# Patient Record
Sex: Female | Born: 1990 | Race: White | Hispanic: No | Marital: Single | Smoking: Never smoker
Health system: Southern US, Community
[De-identification: ages and names within clinical notes are randomized; demographics above are authoritative.]

## PROBLEM LIST (undated history)

## (undated) ENCOUNTER — Inpatient Hospital Stay (HOSPITAL_COMMUNITY): Payer: Self-pay

## (undated) DIAGNOSIS — Q438 Other specified congenital malformations of intestine: Secondary | ICD-10-CM

## (undated) DIAGNOSIS — Z9889 Other specified postprocedural states: Secondary | ICD-10-CM

## (undated) DIAGNOSIS — L989 Disorder of the skin and subcutaneous tissue, unspecified: Secondary | ICD-10-CM

## (undated) DIAGNOSIS — C801 Malignant (primary) neoplasm, unspecified: Secondary | ICD-10-CM

## (undated) HISTORY — DX: Malignant (primary) neoplasm, unspecified: C80.1

## (undated) HISTORY — PX: BREAST SURGERY: SHX581

## (undated) HISTORY — PX: APPENDECTOMY: SHX54

## (undated) HISTORY — PX: OTHER SURGICAL HISTORY: SHX169

## (undated) HISTORY — PX: BREAST LUMPECTOMY: SHX2

---
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
## 2009-03-03 HISTORY — PX: BREAST SURGERY: SHX581

## 2009-04-23 ENCOUNTER — Encounter: Admission: RE | Admit: 2009-04-23 | Discharge: 2009-04-23 | Payer: Self-pay | Admitting: General Surgery

## 2010-03-24 ENCOUNTER — Encounter: Payer: Self-pay | Admitting: General Surgery

## 2011-03-04 NOTE — L&D Delivery Note (Signed)
Delivery Note At 6:00 PM a healthy female was delivered via Vaginal, Spontaneous Delivery (Presentation: LOA ).  APGAR: 9, 9; weight pending .   Placenta status: Intact, Spontaneous.  Cord: 3 vessels with the following complications: None.   Anesthesia: Epidural  Episiotomy: None Lacerations: 1st degree;Perineal and right labial Suture Repair: 2.0 vicryl rapide and 3-0 vicryl rapide Est. Blood Loss (mL): 350cc  Mom to postpartum.  Baby to stay with mom.Marland Kitchen  Oliver Pila 02/12/2012, 6:23 PM

## 2011-07-30 LAB — OB RESULTS CONSOLE GC/CHLAMYDIA: Gonorrhea: POSITIVE

## 2011-07-30 LAB — OB RESULTS CONSOLE HEPATITIS B SURFACE ANTIGEN: Hepatitis B Surface Ag: NEGATIVE

## 2011-07-30 LAB — OB RESULTS CONSOLE RUBELLA ANTIBODY, IGM: Rubella: UNDETERMINED

## 2011-07-30 LAB — OB RESULTS CONSOLE ABO/RH: RH Type: POSITIVE

## 2011-11-28 ENCOUNTER — Inpatient Hospital Stay (HOSPITAL_COMMUNITY)
Admission: AD | Admit: 2011-11-28 | Discharge: 2011-11-28 | Disposition: A | Discharge status: Home / Self Care | Payer: 59 | Source: Ambulatory Visit | Attending: Obstetrics and Gynecology | Admitting: Obstetrics and Gynecology

## 2011-11-28 ENCOUNTER — Encounter (HOSPITAL_COMMUNITY): Payer: Self-pay

## 2011-11-28 DIAGNOSIS — Q438 Other specified congenital malformations of intestine: Secondary | ICD-10-CM | POA: Insufficient documentation

## 2011-11-28 DIAGNOSIS — O26899 Other specified pregnancy related conditions, unspecified trimester: Secondary | ICD-10-CM

## 2011-11-28 DIAGNOSIS — R109 Unspecified abdominal pain: Secondary | ICD-10-CM | POA: Insufficient documentation

## 2011-11-28 DIAGNOSIS — O99891 Other specified diseases and conditions complicating pregnancy: Secondary | ICD-10-CM | POA: Insufficient documentation

## 2011-11-28 HISTORY — DX: Other specified congenital malformations of intestine: Q43.8

## 2011-11-28 LAB — URINALYSIS, ROUTINE W REFLEX MICROSCOPIC
Glucose, UA: NEGATIVE mg/dL
Hgb urine dipstick: NEGATIVE
Leukocytes, UA: NEGATIVE
Protein, ur: NEGATIVE mg/dL
pH: 8.5 — ABNORMAL HIGH (ref 5.0–8.0)

## 2011-11-28 MED ORDER — GI COCKTAIL ~~LOC~~
30.0000 mL | Freq: Once | ORAL | Status: AC
  Administered 2011-11-28: 30 mL via ORAL
  Filled 2011-11-28: qty 30

## 2011-11-28 NOTE — MAU Provider Note (Signed)
Chief Complaint:  Abdominal Pain   First Provider Initiated Contact with Patient 11/28/11 1936      HPI: Terri Wright is a 21 y.o. G1P0 at 86w0dwho presents to maternity admissions reporting constant upper abdominal pain since 1100 today. She describes it as sharp pinching pain throughout her upper abdomen that is constant but waxes and wanes. She has gotten partial relief from rest. She does not related to fetal movement or to her activity. No radiation or back pain. No similar previous episodes. Denies illness, N/V/D/C. No reflux symptoms.  Denies contractions, leakage of fluid or vaginal bleeding. Fetus is less active than usual but moves several times/hr.  Pregnancy Course: uncomplicated  Past Medical History: Past Medical History  Diagnosis Date  . Redundant colon     Past obstetric history: OB History    Grav Para Term Preterm Abortions TAB SAB Ect Mult Living   1              # Outc Date GA Lbr Len/2nd Wgt Sex Del Anes PTL Lv   1 CUR               Past Surgical History: Past Surgical History  Procedure Date  . Breast surgery     lumps removed  . Abdominal exploration surgery 2008    with diagnosis of redundant colon    Family History: Family History  Problem Relation Age of Onset  . Other Neg Hx     Social History: History  Substance Use Topics  . Smoking status: Never Smoker   . Smokeless tobacco: Not on file  . Alcohol Use: No    Allergies: No Known Allergies  Meds:  Prescriptions prior to admission  Medication Sig Dispense Refill  . Prenatal Vit-Fe Fumarate-FA (PRENATAL MULTIVITAMIN) TABS Take 1 tablet by mouth daily.        ROS: Pertinent findings in history of present illness.  Physical Exam  Blood pressure 119/75, pulse 98, temperature 99.2 F (37.3 C), temperature source Oral, resp. rate 16, height 5' 4.5" (1.638 m), weight 75.932 kg (167 lb 6.4 oz). GENERAL: Well-developed, well-nourished female in no acute distress, somewhat anxious    HEENT: normocephalic HEART: normal rate RESP: normal effort ABDOMEN: Soft, non-tender, gravid appropriate for gestational age EXTREMITIES: Nontender, no edema NEURO: alert and oriented CX: soft posterior, closed/ thick/-3 cephalic  FHT:  Baseline 130 , moderate variability, accelerations present, no decelerations Contractions: none   Labs: Results for orders placed during the hospital encounter of 11/28/11 (from the past 24 hour(s))  URINALYSIS, ROUTINE W REFLEX MICROSCOPIC     Status: Abnormal   Collection Time   11/28/11  7:00 PM      Component Value Range   Color, Urine YELLOW  YELLOW   APPearance CLOUDY (*) CLEAR   Specific Gravity, Urine 1.015  1.005 - 1.030   pH 8.5 (*) 5.0 - 8.0   Glucose, UA NEGATIVE  NEGATIVE mg/dL   Hgb urine dipstick NEGATIVE  NEGATIVE   Bilirubin Urine NEGATIVE  NEGATIVE   Ketones, ur NEGATIVE  NEGATIVE mg/dL   Protein, ur NEGATIVE  NEGATIVE mg/dL   Urobilinogen, UA 0.2  0.0 - 1.0 mg/dL   Nitrite NEGATIVE  NEGATIVE   Leukocytes, UA NEGATIVE  NEGATIVE     ED Course GI Cocktail given without relief of sx. Still lokks comfortable  Assessment: 1. Abdominal pain in pregnancy   2. Redundant colon   Discomfort possibly due to colon anomaly or late gestational discomforts Gravida1 at  28 wks Cat 1 FHR  Plan: C/W Dr. Ellyn Hack -> try GI cocktail       Medication List     As of 11/28/2011  7:37 PM    ASK your doctor about these medications         prenatal multivitamin Tabs   Take 1 tablet by mouth daily.      F/U 12/08/11 in office. Call sooner if worse.  Danae Orleans, CNM 11/28/2011 7:37 PM

## 2011-11-28 NOTE — MAU Note (Signed)
Constant mid to upper abdominal pain since 11am this morning. Can't tell if pain is contractions or if abdomen tightens with pain. Denies nausea, vomiting, diarrhea or constipation. Denies vaginal bleeding. Positive fetal movement.

## 2012-02-12 ENCOUNTER — Inpatient Hospital Stay (HOSPITAL_COMMUNITY)
Admission: AD | Admit: 2012-02-12 | Discharge: 2012-02-14 | DRG: 775 | Disposition: A | Discharge status: Home / Self Care | Payer: 59 | Source: Ambulatory Visit | Attending: Obstetrics and Gynecology | Admitting: Obstetrics and Gynecology

## 2012-02-12 ENCOUNTER — Encounter (HOSPITAL_COMMUNITY): Payer: Self-pay | Admitting: *Deleted

## 2012-02-12 ENCOUNTER — Encounter (HOSPITAL_COMMUNITY): Payer: Self-pay | Admitting: Anesthesiology

## 2012-02-12 ENCOUNTER — Inpatient Hospital Stay (HOSPITAL_COMMUNITY): Payer: 59 | Admitting: Anesthesiology

## 2012-02-12 DIAGNOSIS — O4290 Premature rupture of membranes, unspecified as to length of time between rupture and onset of labor, unspecified weeks of gestation: Secondary | ICD-10-CM | POA: Diagnosis present

## 2012-02-12 LAB — CBC
HCT: 29.3 % — ABNORMAL LOW (ref 36.0–46.0)
MCH: 28 pg (ref 26.0–34.0)
MCV: 84.7 fL (ref 78.0–100.0)
RDW: 12.9 % (ref 11.5–15.5)
WBC: 10.2 10*3/uL (ref 4.0–10.5)

## 2012-02-12 MED ORDER — DIPHENHYDRAMINE HCL 50 MG/ML IJ SOLN: 12.5000 mg | INTRAMUSCULAR | Status: DC | PRN

## 2012-02-12 MED ORDER — SENNOSIDES-DOCUSATE SODIUM 8.6-50 MG PO TABS
2.0000 | ORAL_TABLET | Freq: Every day | ORAL | Status: DC
  Administered 2012-02-12 – 2012-02-13 (×2): 2 via ORAL

## 2012-02-12 MED ORDER — BUTORPHANOL TARTRATE 1 MG/ML IJ SOLN
1.0000 mg | INTRAMUSCULAR | Status: DC | PRN
  Administered 2012-02-12: 1 mg via INTRAVENOUS
  Filled 2012-02-12: qty 1

## 2012-02-12 MED ORDER — PHENYLEPHRINE 40 MCG/ML (10ML) SYRINGE FOR IV PUSH (FOR BLOOD PRESSURE SUPPORT): 80.0000 ug | PREFILLED_SYRINGE | INTRAVENOUS | Status: DC | PRN

## 2012-02-12 MED ORDER — EPHEDRINE 5 MG/ML INJ: 10.0000 mg | INTRAVENOUS | Status: DC | PRN

## 2012-02-12 MED ORDER — EPHEDRINE 5 MG/ML INJ
10.0000 mg | INTRAVENOUS | Status: DC | PRN
  Filled 2012-02-12: qty 4

## 2012-02-12 MED ORDER — OXYTOCIN 40 UNITS IN LACTATED RINGERS INFUSION - SIMPLE MED
1.0000 m[IU]/min | INTRAVENOUS | Status: DC
  Administered 2012-02-12: 1 m[IU]/min via INTRAVENOUS
  Filled 2012-02-12: qty 1000

## 2012-02-12 MED ORDER — SIMETHICONE 80 MG PO CHEW: 80.0000 mg | CHEWABLE_TABLET | ORAL | Status: DC | PRN

## 2012-02-12 MED ORDER — OXYCODONE-ACETAMINOPHEN 5-325 MG PO TABS: 1.0000 | ORAL_TABLET | ORAL | Status: DC | PRN

## 2012-02-12 MED ORDER — TERBUTALINE SULFATE 1 MG/ML IJ SOLN: 0.2500 mg | Freq: Once | INTRAMUSCULAR | Status: DC | PRN

## 2012-02-12 MED ORDER — LACTATED RINGERS IV SOLN: 500.0000 mL | Freq: Once | INTRAVENOUS | Status: DC

## 2012-02-12 MED ORDER — LACTATED RINGERS IV SOLN: 500.0000 mL | INTRAVENOUS | Status: DC | PRN

## 2012-02-12 MED ORDER — OXYTOCIN 40 UNITS IN LACTATED RINGERS INFUSION - SIMPLE MED
62.5000 mL/h | INTRAVENOUS | Status: DC
  Administered 2012-02-12: 62.5 mL/h via INTRAVENOUS

## 2012-02-12 MED ORDER — DIBUCAINE 1 % RE OINT: 1.0000 "application " | TOPICAL_OINTMENT | RECTAL | Status: DC | PRN

## 2012-02-12 MED ORDER — PRENATAL MULTIVITAMIN CH
1.0000 | ORAL_TABLET | Freq: Every day | ORAL | Status: DC
  Filled 2012-02-12 (×2): qty 1

## 2012-02-12 MED ORDER — ZOLPIDEM TARTRATE 5 MG PO TABS: 5.0000 mg | ORAL_TABLET | Freq: Every evening | ORAL | Status: DC | PRN

## 2012-02-12 MED ORDER — ONDANSETRON HCL 4 MG/2ML IJ SOLN: 4.0000 mg | Freq: Four times a day (QID) | INTRAMUSCULAR | Status: DC | PRN

## 2012-02-12 MED ORDER — BENZOCAINE-MENTHOL 20-0.5 % EX AERO: 1.0000 "application " | INHALATION_SPRAY | CUTANEOUS | Status: DC | PRN

## 2012-02-12 MED ORDER — LANOLIN HYDROUS EX OINT: TOPICAL_OINTMENT | CUTANEOUS | Status: DC | PRN

## 2012-02-12 MED ORDER — FLEET ENEMA 7-19 GM/118ML RE ENEM: 1.0000 | ENEMA | RECTAL | Status: DC | PRN

## 2012-02-12 MED ORDER — CITRIC ACID-SODIUM CITRATE 334-500 MG/5ML PO SOLN: 30.0000 mL | ORAL | Status: DC | PRN

## 2012-02-12 MED ORDER — ACETAMINOPHEN 325 MG PO TABS: 650.0000 mg | ORAL_TABLET | ORAL | Status: DC | PRN

## 2012-02-12 MED ORDER — TETANUS-DIPHTH-ACELL PERTUSSIS 5-2.5-18.5 LF-MCG/0.5 IM SUSP: 0.5000 mL | Freq: Once | INTRAMUSCULAR | Status: DC

## 2012-02-12 MED ORDER — LACTATED RINGERS IV SOLN
INTRAVENOUS | Status: DC
  Administered 2012-02-12 (×4): via INTRAVENOUS

## 2012-02-12 MED ORDER — LIDOCAINE HCL (PF) 1 % IJ SOLN
30.0000 mL | INTRAMUSCULAR | Status: DC | PRN
  Filled 2012-02-12: qty 30

## 2012-02-12 MED ORDER — ONDANSETRON HCL 4 MG PO TABS: 4.0000 mg | ORAL_TABLET | ORAL | Status: DC | PRN

## 2012-02-12 MED ORDER — DIPHENHYDRAMINE HCL 25 MG PO CAPS: 25.0000 mg | ORAL_CAPSULE | Freq: Four times a day (QID) | ORAL | Status: DC | PRN

## 2012-02-12 MED ORDER — FENTANYL 2.5 MCG/ML BUPIVACAINE 1/10 % EPIDURAL INFUSION (WH - ANES)
14.0000 mL/h | INTRAMUSCULAR | Status: DC
  Administered 2012-02-12: 14 mL/h via EPIDURAL
  Filled 2012-02-12: qty 125

## 2012-02-12 MED ORDER — ONDANSETRON HCL 4 MG/2ML IJ SOLN: 4.0000 mg | INTRAMUSCULAR | Status: DC | PRN

## 2012-02-12 MED ORDER — PHENYLEPHRINE 40 MCG/ML (10ML) SYRINGE FOR IV PUSH (FOR BLOOD PRESSURE SUPPORT)
80.0000 ug | PREFILLED_SYRINGE | INTRAVENOUS | Status: DC | PRN
  Filled 2012-02-12: qty 5

## 2012-02-12 MED ORDER — BUTORPHANOL TARTRATE 1 MG/ML IJ SOLN
1.0000 mg | Freq: Once | INTRAMUSCULAR | Status: AC
  Administered 2012-02-12: 1 mg via INTRAVENOUS
  Filled 2012-02-12: qty 1

## 2012-02-12 MED ORDER — IBUPROFEN 600 MG PO TABS: 600.0000 mg | ORAL_TABLET | Freq: Four times a day (QID) | ORAL | Status: DC | PRN

## 2012-02-12 MED ORDER — LIDOCAINE HCL (PF) 1 % IJ SOLN
INTRAMUSCULAR | Status: DC | PRN
  Administered 2012-02-12 (×2): 5 mL
  Administered 2012-02-12: 30 mL via INTRAMUSCULAR

## 2012-02-12 MED ORDER — IBUPROFEN 600 MG PO TABS
600.0000 mg | ORAL_TABLET | Freq: Four times a day (QID) | ORAL | Status: DC
  Administered 2012-02-13 – 2012-02-14 (×6): 600 mg via ORAL
  Filled 2012-02-12 (×6): qty 1

## 2012-02-12 MED ORDER — OXYTOCIN BOLUS FROM INFUSION
500.0000 mL | INTRAVENOUS | Status: DC
  Administered 2012-02-12: 500 mL via INTRAVENOUS

## 2012-02-12 MED ORDER — WITCH HAZEL-GLYCERIN EX PADS: 1.0000 "application " | MEDICATED_PAD | CUTANEOUS | Status: DC | PRN

## 2012-02-12 NOTE — MAU Provider Note (Signed)
S: 21 y.o. G1P0 @ [redacted]w[redacted]d presents to MAU with leakage of clear fluid x2-3 episodes.  Pt is wearing a pad for this leakage upon arrival.  She reports good fetal movement, denies vaginal bleeding, vaginal itching/burning, urinary symptoms, h/a, dizziness, n/v, or fever/chills.    O: BP 130/86  Pulse 105  Temp 97.9 F (36.6 C) (Oral)  Resp 18  Speculum exam:  Pt grossly ruptured with positive pooling and large leakage of fluid onto bed with placement of speculum    A: SROM @[redacted]w[redacted]d   P: RN to call MD regarding results  Sharen Counter Certified Nurse-Midwife

## 2012-02-12 NOTE — Progress Notes (Signed)
Patient ID: Terri Wright, female   DOB: Nov 14, 1990, 21 y.o.   MRN: [id number] Pt is not feeling many contractions yet, but is started on pitocin FHR reactive Cervix at 0745 am 50/3-4/-1 per RN exam Will follow progress.

## 2012-02-12 NOTE — H&P (Signed)
NAMEDAYNE, DEKAY NO.:  [id number]  MEDICAL RECORD NO.:  [id number]  LOCATION:  9175                          FACILITY:  [facility]  PHYSICIAN:  Malachi Pro. Ambrose Mantle, M.D. DATE OF BIRTH:  [birth year]  DATE OF ADMISSION:  02/12/2012 DATE OF DISCHARGE:                             HISTORY & PHYSICAL   PRESENT ILLNESS:  This is a 21 year old white female, para 0, gravida 1, with EDC February 20, 2012, admitted with premature rupture of the membranes.  The patient states that her membranes began leaking at 1:45 a.m.  She came to the hospital at 3:17 a.m.  Rupture of membranes was confirmed and she was kept in MAU for 4 hours waiting for a bed.  She has now been admitted.  Blood group and type A positive with a negative antibody.  Pap smear negative, rubella equivocal, RPR nonreactive. Urine culture negative.  Hepatitis B surface antigen negative, HIV negative.  According to the lab slip on Jul 30, 2011, GC was positive, Chlamydia was negative, however, the patient was not treated and it was entered into her progress notes that all labs were negative, so I think it is a clerical error.  The patient was never informed that she had a positive test.  Cystic fibrosis screening and first trimester screening as well as AFP were all declined.  One hour Glucola was 107, group B strep was negative, and repeat gonorrhea and chlamydia at 36 weeks were negative.  The patient began her prenatal course in our office at 10 weeks and 5 days.  She declined all genetic screenings.  Her prenatal course was essentially uncomplicated.  At her last exam in the office on February 10, 2012, the cervix was 3 cm, 80% effaced, vertex was thought to be at a 0 station.  She is admitted now for induction of labor if spontaneous labor does not ensue.  PAST MEDICAL HISTORY:  She states that she has infrequent bowel movements secondary to an excessively long colon.  PAST SURGICAL HISTORY:  Surgeries  include breast lumpectomies x3, some type of colon surgery to be sure how much colon there was.  ALLERGIES:  She has no known allergies.  FAMILY MEDICAL HISTORY:  Father and mother have had depression.  Father has high blood pressure as well as her mother.  Maternal grandmother has arthritis and COPD as well as hypertension.  Maternal grandfather with epilepsy and paternal grandmother with breast cancer.  The patient has never been pregnant.  She is active but it does not have a formal exercise program.  She denies alcohol, tobacco, and illicit substance abuse.  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.  PHYSICAL EXAMINATION:  VITAL SIGNS:  Temperature 97.9, pulse 88, respirations 18, blood pressure 116/86. HEART:  Normal size and sounds.  No murmurs. LUNGS:  Clear to auscultation. PELVIC:  Fundal height at her last prenatal visit was 37 cm per the RN exam in labor and delivery.  The cervix is 3-4 cm, 50%, posterior and the vertex is high.  ADMITTING IMPRESSION:  Intrauterine pregnancy at 38 weeks and 6 days, premature rupture of the membranes.  The patient is admitted  for observation and if no labor, begin Pitocin.     Malachi Pro. Ambrose Mantle, M.D.     TFH/MEDQ  D:  02/12/2012  T:  02/12/2012  Job:  [id number]

## 2012-02-12 NOTE — MAU Note (Signed)
Had a gush of fluid around 0145 this morning. Has been trickling every since

## 2012-02-12 NOTE — Anesthesia Preprocedure Evaluation (Signed)

## 2012-02-12 NOTE — MAU Note (Signed)
Speculum exam performed by Vivien Presto CNM . Positive for pooling

## 2012-02-12 NOTE — Anesthesia Procedure Notes (Signed)
Epidural Patient location during procedure: OB Start time: 02/12/2012 4:26 PM  Staffing Anesthesiologist: Brayton Caves R Performed by: anesthesiologist   Preanesthetic Checklist Completed: patient identified, site marked, surgical consent, pre-op evaluation, timeout performed, IV checked, risks and benefits discussed and monitors and equipment checked  Epidural Patient position: sitting Prep: site prepped and draped and DuraPrep Patient monitoring: continuous pulse ox and blood pressure Approach: midline Injection technique: LOR air and LOR saline  Needle:  Needle type: Tuohy  Needle gauge: 17 G Needle length: 9 cm and 9 Needle insertion depth: 5 cm cm Catheter type: closed end flexible Catheter size: 19 Gauge Catheter at skin depth: 10 cm Test dose: negative  Assessment Events: blood not aspirated, injection not painful, no injection resistance, negative IV test and no paresthesia  Additional Notes Patient identified.  Risk benefits discussed including failed block, incomplete pain control, headache, nerve damage, paralysis, blood pressure changes, nausea, vomiting, reactions to medication both toxic or allergic, and postpartum back pain.  Patient expressed understanding and wished to proceed.  All questions were answered.  Sterile technique used throughout procedure and epidural site dressed with sterile barrier dressing. No paresthesia or other complications noted.The patient did not experience any signs of intravascular injection such as tinnitus or metallic taste in mouth nor signs of intrathecal spread such as rapid motor block. Please see nursing notes for vital signs.

## 2012-02-13 LAB — CBC
HCT: 25.2 % — ABNORMAL LOW (ref 36.0–46.0)
Hemoglobin: 8.2 g/dL — ABNORMAL LOW (ref 12.0–15.0)
MCH: 27.7 pg (ref 26.0–34.0)
MCHC: 32.5 g/dL (ref 30.0–36.0)
RDW: 12.8 % (ref 11.5–15.5)

## 2012-02-13 NOTE — Anesthesia Postprocedure Evaluation (Signed)
Anesthesia Post Note  Patient: Terri Wright  Procedure(s) Performed: * No procedures listed *  Anesthesia type: Epidural  Patient location: Mother/Baby  Post pain: Pain level controlled  Post assessment: Post-op Vital signs reviewed  Last Vitals:  Filed Vitals:   02/13/12 0547  BP: 99/70  Pulse: 91  Temp: 37.1 C  Resp: 18    Post vital signs: Reviewed  Level of consciousness: awake  Complications: No apparent anesthesia complications

## 2012-02-13 NOTE — Progress Notes (Signed)
Post Partum Day 1 Subjective: no complaints, up ad lib and tolerating PO  Objective: Blood pressure 99/70, pulse 91, temperature 98.8 F (37.1 C), temperature source Oral, resp. rate 18, height 5\' 5"  (1.651 m), weight 85.276 kg (188 lb), SpO2 97.00%, unknown if currently breastfeeding.  Physical Exam:  General: alert and cooperative Lochia: appropriate Uterine Fundus: firm    Basename 02/12/12 0430  HGB 9.7*  HCT 29.3*    Assessment/Plan: Plan for discharge tomorrow   LOS: 1 day   Terri Wright W 02/13/2012, 6:28 AM

## 2012-02-14 MED ORDER — MEASLES, MUMPS & RUBELLA VAC ~~LOC~~ INJ
0.5000 mL | INJECTION | Freq: Once | SUBCUTANEOUS | Status: AC
  Administered 2012-02-14: 0.5 mL via SUBCUTANEOUS
  Filled 2012-02-14: qty 0.5

## 2012-02-14 NOTE — Discharge Summary (Signed)
Obstetric Discharge Summary Reason for Admission: rupture of membranes Prenatal Procedures: none Intrapartum Procedures: spontaneous vaginal delivery Postpartum Procedures: none Complications-Operative and Postpartum: 1st degree perineal laceration and labial laceration Hemoglobin  Date Value Range Status  02/13/2012 8.2* 12.0 - 15.0 g/dL Final     HCT  Date Value Range Status  02/13/2012 25.2* 36.0 - 46.0 % Final    Physical Exam:  General: alert Lochia: appropriate Uterine Fundus: firm  Discharge Diagnoses: Term Pregnancy-delivered  Discharge Information: Date: 02/14/2012 Activity: pelvic rest Diet: routine Medications: Ibuprofen Condition: stable Instructions: refer to practice specific booklet Discharge to: home Follow-up Information    Follow up with Oliver Pila, MD. Schedule an appointment as soon as possible for a visit in 6 weeks.   Contact information:   [address], [address] [place] [address] [phone]          Newborn Data: Live born female  Birth Weight: 6 lb 14.4 oz (3130 g) APGAR: 9, 9  Home with mother.  Terri Wright 02/14/2012, 9:56 AM

## 2012-02-14 NOTE — Progress Notes (Signed)
PPD #2 Doing well Afeb, VSS Fundus firm D/c home 

## 2012-10-27 ENCOUNTER — Ambulatory Visit (INDEPENDENT_AMBULATORY_CARE_PROVIDER_SITE_OTHER): Payer: 59 | Admitting: Physician Assistant

## 2012-10-27 VITALS — BP 110/70 | HR 90 | Temp 98.4°F | Resp 16 | Ht 65.0 in | Wt 154.2 lb

## 2012-10-27 DIAGNOSIS — H6690 Otitis media, unspecified, unspecified ear: Secondary | ICD-10-CM

## 2012-10-27 DIAGNOSIS — H6691 Otitis media, unspecified, right ear: Secondary | ICD-10-CM

## 2012-10-27 MED ORDER — AMOXICILLIN 500 MG PO TABS: 1000.0000 mg | ORAL_TABLET | Freq: Three times a day (TID) | ORAL | Status: DC

## 2012-10-27 NOTE — Patient Instructions (Addendum)
Begin taking the antibiotics today - take as directed and be sure to finish the full course, even if you start feeling better.  Tylenol or Advil for pain relief.  Please let us know if any symptoms are worsening or not improving   Otitis Media, Adult A middle ear infection is an infection in the space behind the eardrum. The medical name for this is "otitis media." It may happen after a common cold. It is caused by a germ that starts growing in that space. You may feel swollen glands in your neck on the side of the ear infection. HOME CARE INSTRUCTIONS   Take your medicine as directed until it is gone, even if you feel better after the first few days.  Only take over-the-counter or prescription medicines for pain, discomfort, or fever as directed by your caregiver.  Occasional use of a nasal decongestant a couple times per day may help with discomfort and help the eustachian tube to drain better. Follow up with your caregiver in 10 to 14 days or as directed, to be certain that the infection has cleared. Not keeping the appointment could result in a chronic or permanent injury, pain, hearing loss and disability. If there is any problem keeping the appointment, you must call back to this facility for assistance. SEEK IMMEDIATE MEDICAL CARE IF:   You are not getting better in 2 to 3 days.  You have pain that is not controlled with medication.  You feel worse instead of better.  You cannot use the medication as directed.  You develop swelling, redness or pain around the ear or stiffness in your neck. MAKE SURE YOU:   Understand these instructions.  Will watch your condition.  Will get help right away if you are not doing well or get worse. Document Released: 11/23/2003 Document Revised: 05/12/2011 Document Reviewed: 09/24/2007 [facility] Patient Information 2014 [place], [place].

## 2012-10-27 NOTE — Progress Notes (Signed)
  Subjective:    Patient ID: Terri Wright, female    DOB: [birth year], 22 y.o.   MRN: [id number]  HPI   Ms. Zakarian is a pleasant 22 yr old female here with right ear pain that began last night.  Started about 7:30pm.  At first felt pressure like she was driving through the mountains.  Pain continued to get more intense.  Hardly got any sleep last night due to pain.  Did take some Tylenol to help her get some sleep.  Hearing muffled on the right.  Left ear feels totally fine.  No URI symptoms, but did have a little ST earlier in the week.  No allergy problems.  Has tried several home remedies incl ear drops, peroxide, warm water, etc - all with no relief.  Does not some drainage from the ear, but thinks this may be from the drops she put in last night.  Denies FB.    Review of Systems  Constitutional: Negative for fever and chills.  HENT: Positive for hearing loss, ear pain and ear discharge. Negative for congestion and rhinorrhea.   Respiratory: Negative.   Cardiovascular: Negative.   Gastrointestinal: Negative.   Musculoskeletal: Negative.   Skin: Negative.   Neurological: Negative.        Objective:   Physical Exam  Vitals reviewed. Constitutional: She is oriented to person, place, and time. She appears well-developed and well-nourished. No distress.  HENT:  Head: Normocephalic and atraumatic.  Right Ear: External ear and ear canal normal. Tympanic membrane is erythematous and bulging.  Left Ear: Tympanic membrane, external ear and ear canal normal.  Eyes: Conjunctivae are normal. No scleral icterus.  Neck: Neck supple.  Cardiovascular: Normal rate, regular rhythm and normal heart sounds.   Pulmonary/Chest: Effort normal and breath sounds normal.  Lymphadenopathy:    She has no cervical adenopathy.  Neurological: She is alert and oriented to person, place, and time.  Skin: Skin is warm and dry.  Psychiatric: She has a normal mood and affect. Her behavior is normal.         Assessment & Plan:  Otitis media, right - Plan: amoxicillin (AMOXIL) 500 MG tablet   Ms. Myron is a 22 yr old female with RIGHT otitis media.  Will treat with amox 1g TID x 10 days.  Tylenol and/or Advil for pain relief.  May try otc decongestant to help relieve pressure.  If any symptoms worsening or not improving pt to RTC.

## 2013-06-02 ENCOUNTER — Ambulatory Visit (INDEPENDENT_AMBULATORY_CARE_PROVIDER_SITE_OTHER): Payer: 59

## 2013-06-02 ENCOUNTER — Encounter: Payer: Self-pay | Admitting: Orthopedic Surgery

## 2013-06-02 ENCOUNTER — Ambulatory Visit (INDEPENDENT_AMBULATORY_CARE_PROVIDER_SITE_OTHER): Payer: 59 | Admitting: Orthopedic Surgery

## 2013-06-02 VITALS — BP 125/82 | Ht 65.0 in | Wt 164.0 lb

## 2013-06-02 DIAGNOSIS — M2240 Chondromalacia patellae, unspecified knee: Secondary | ICD-10-CM

## 2013-06-02 DIAGNOSIS — M25569 Pain in unspecified knee: Secondary | ICD-10-CM

## 2013-06-02 DIAGNOSIS — M2241 Chondromalacia patellae, right knee: Secondary | ICD-10-CM

## 2013-06-02 DIAGNOSIS — M25561 Pain in right knee: Secondary | ICD-10-CM

## 2013-06-02 MED ORDER — NABUMETONE 500 MG PO TABS: 500.0000 mg | ORAL_TABLET | Freq: Two times a day (BID) | ORAL | Status: DC

## 2013-06-02 MED ORDER — TRAMADOL-ACETAMINOPHEN 37.5-325 MG PO TABS: 1.0000 | ORAL_TABLET | ORAL | Status: DC | PRN

## 2013-06-02 NOTE — Patient Instructions (Addendum)
Call to arrange therapy at [facility] exam shows your knee pain is likely due to a cartilage swelling and irritation under the knee cap called chondromalacia. The knee cap moves up and down in its groove when you walk, run, or squat. It can become irritated from sports or work activities if the knee cap is not lined up perfectly or your quadriceps muscle is relatively weak. This can cause pain, usually around the knee cap but sometimes the back of the knee. It is most common in young and active people. Climbing stairs, prolonged sitting and rising from a chair will often make the pain worse. Treatment includes rest from activities which make it worse. The pain can be reduced with ice packs and anti-inflammatory pain medicine. Exercises to strengthen the thigh (quadriceps) muscle may help prevent further episodes of this condition. Shoe inserts to correct imbalances in the legs or feet may be prescribed by your doctor or a specialist. Support for the knee cap with a light brace may also be helpful. Call your caregiver if you are not improving after 2 - 3 weeks of treatment.  SEEK MEDICAL CARE IF:  You have increasing pain or your knee becomes hot, swollen, red, or begins to give out or lock up on you. Document Released: 03/27/2004 Document Revised: 05/12/2011 Document Reviewed: 08/15/2008 [facility] Patient Information 2014 [place].

## 2013-06-02 NOTE — Progress Notes (Signed)
Patient ID: Terri Wright, female   DOB: [birth year], 23 y.o.   MRN: [id number]  Chief Complaint  Patient presents with  . Knee Pain    Right knee pain injury 04/16/13    History  Year-old female history of pain in her right knee on and off for years but became worse after she was injured in February during a ski trip. At that time she injured the knee it became bruised and swollen there was questionable also going to the ER she declined but she wants her knee checked out because of the continued pain. The pain comes and is associated with stairs sitting for long periods of time. The pain is moderately severe as 7/10 described as sharp stabbing feels a catching sensation under the knee cap there is some swelling noted  Review of systems negative except for musculoskeletal  No allergies  History of redundant colon  History of exploratory laparotomy and lumpectomies/cystectomies from the breast unclear on her history there.  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  Cardiovascular exam is normal Sensory exam normal  Encounter Diagnoses  Name Primary?  . Right knee pain   . Chondromalacia of patella, right Yes    The x-rays were normal  Followup after therapy  Meds ordered this encounter  Medications  . nabumetone (RELAFEN) 500 MG tablet    Sig: Take 1 tablet (500 mg total) by mouth 2 (two) times daily.    Dispense:   60 tablet    Refill:  5  . traMADol-acetaminophen (ULTRACET) 37.5-325 MG per tablet    Sig: Take 1 tablet by mouth every 4 (four) hours as needed.    Dispense:  90 tablet    Refill:  5

## 2013-06-15 ENCOUNTER — Ambulatory Visit (HOSPITAL_COMMUNITY)
Admission: RE | Admit: 2013-06-15 | Discharge: 2013-06-15 | Disposition: A | Discharge status: Home / Self Care | Payer: 59 | Source: Ambulatory Visit | Attending: Orthopedic Surgery | Admitting: Orthopedic Surgery

## 2013-06-15 DIAGNOSIS — IMO0001 Reserved for inherently not codable concepts without codable children: Secondary | ICD-10-CM | POA: Insufficient documentation

## 2013-06-15 DIAGNOSIS — M25569 Pain in unspecified knee: Secondary | ICD-10-CM | POA: Insufficient documentation

## 2013-06-15 DIAGNOSIS — R269 Unspecified abnormalities of gait and mobility: Secondary | ICD-10-CM | POA: Insufficient documentation

## 2013-06-15 NOTE — Evaluation (Signed)
Physical Therapy Evaluation  Patient Details  Name: Terri Wright MRN: [id number] Date of Birth: 12-Dec-1990  Today's Date: 06/15/2013 Time: 0530-0620 PT Time Calculation (min): 50 min  Charges:1 evaluation, 6:05-6:20 Therapeutic Exercise            Visit#: 1 of 16  Re-eval: 07/15/13 Assessment Diagnosis: Knee pain Next MD Visit: week of the 25th of may Prior Therapy: never  Authorization: [facility]     Past Medical History:  Past Medical History  Diagnosis Date  . Redundant colon    Past Surgical History:  Past Surgical History  Procedure Laterality Date  . Breast surgery      lumps removed  . Abdominal exploration surgery  2008    with diagnosis of redundant colon  . Breast lumpectomy    . Appendectomy      Subjective Symptoms/Limitations Symptoms: sharp pain, dull ache that slowly eases.  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 histry of knee pain for 7 years with increased pain since February during which she fell and had major pain. no noted pop or pull but she fell tknee twist. Knee was swollen and bruised tho none now. Patient had her first child 24 years ago.  Limitations: Sitting How long can you sit comfortably?: pain following prolonged sitting.  Pain Assessment Currently in Pain?: No/denies Pain Score: 6  (when going up stairs. ) Pain Location: Knee Pain Orientation: Right Pain Radiating Towards: up from knee allong lateral thigh Pain Onset: More than a month ago Pain Frequency: Intermittent Pain Relieving Factors: massage.  Effect of Pain on Daily Activities: running, hiking, going up stairs. playing with daughter,   Precautions/Restrictions  Precautions Precautions: None  Prior Functioning  Prior Function Level of Independence: Independent with basic ADLs  Cognition/Observation Cognition Overall Cognitive Status: Within Functional Limits for tasks assessed Arousal/Alertness: Awake/alert Observation/Other Assessments Observations: Gait:  Increased midfoot pronation through out gait, limited tibial internal rotation in transition zone 1, excessive toe out, and increased knee valgus moment in stance. Other Assessments: 3D hip excursion: limited hip extension, limited hip flexion, limited hip adduction, limited hip  internal rotation.   Sensation/Coordination/Flexibility/Functional Tests Sensation Light Touch: Appears Intact Coordination Gross Motor Movements are Fluid and Coordinated: Yes Flexibility Thomas: Positive Obers: Positive 90/90: Positive Functional Tests Functional Tests: Piriformis test: positive for limited mobility,  Functional Tests: Squatting: limited depth due to instability and limited glut/calf flexibility Functional Tests: single leg squat: depth <45degrees  Assessment RLE AROM (degrees) RLE Overall AROM Comments: patient R foot displays decreased midfoot locking mechanism with calcaneal inversion compared to Lt LE resulting in increased midfoot pronation prior to toe off.  Right Hip Extension: 10 Right Hip Flexion: 115 Right Hip ABduction: 30 Right Hip ADduction: 15 Right Knee Extension: 0 Right Knee Flexion: 125 Right Ankle Dorsiflexion: 8 RLE Strength Right Hip Flexion: 5/5 Right Hip Extension: 4/5 Right Hip External Rotation : 40 Right Hip Internal Rotation : 29 Right Hip ABduction: 4/5 Right Hip ADduction: 4/5 Right Knee Flexion: 5/5 Right Knee Extension: 5/5 Right Ankle Dorsiflexion: 5/5 Right Ankle Plantar Flexion: 4/5  Exercise/Treatments Mobility/Balance  High Level Balance High Level Balance Comments: single leg standing balance with forward reach: limited but equal bilaterally.    Stretches Active Hamstring Stretch:  (3 way 10x 3seconds to 14" box) Hip Flexor Stretch:  (3 way, 10x, 3second hold to 14"  box) Piriformis Stretch:  (10x 3 seconds, seated) Gastroc Stretch:  (10x 3seconds, 3 ways) Other Standing Knee stretches: Groin stretch to 14" box 10x  3 seconds, 2 ways.      Physical Therapy Assessment and Plan PT Assessment and Plan Clinical Impression Statement: Patient displays Rt lateral knee pain with squating and step up activities secondary to limited Rt and Lt LE muscle mobility limitations and decreased glut med/max strength resulting in decreased knee stability and inability to squat to floor to pick up child. Contributing factorsinclude: abnormal gait with increased midfoot pronation, limited tibial internal rotation following heel strike, increased knee valgus moment at foot flat, an increased toe out throughout gait pattern, llimited mobility of piriformis, hip flexor, groin, hamstring, and calf muscles. Patient was educated in stretches for HEP for which durign her performance of said exercsises she experienced no pain and improved squat depth following said stretches.  Patient was also educated in possible need for orthotics or more appropriate shoe wear to decreased midfoot pronation and subsequent abnormal forces being put on her knee.  Pt will benefit from skilled therapeutic intervention in order to improve on the following deficits: Abnormal gait;Decreased activity tolerance;Decreased range of motion;Decreased strength;Impaired flexibility Rehab Potential: Good Clinical Impairments Affecting Rehab Potential: patient is highly motiated by care taking requirements of her child.  PT Frequency: Min 2X/week PT Duration: 8 weeks PT Treatment/Interventions: Gait training;Stair training;Functional mobility training;Therapeutic exercise;Balance training;Manual techniques;Patient/family education PT Plan: Continue stretching routine, assess and grogress joint mobility of ankle and hip as appropriate. initiated glut strengthenign nextsession via lunging and squatting exercises.   Add ITB stretch  Goals Home Exercise Program Pt/caregiver will Perform Home Exercise Program: For increased ROM;For increased strengthening PT Goal: Perform Home Exercise Program -  Progress: Goal set today PT Short Term Goals Time to Complete Short Term Goals: 4 weeks PT Short Term Goal 1: Increase hip internal rotation to 40 degrees bilaterally to improve LE shock absorption during heel strike during gait.  PT Short Term Goal 2: Increase hip flexor flexibility so that patient's thomas test will be negative to improve stride length during gait PT Short Term Goal 3: Increase iliiotibial band, tensor fascial latae and bceps femoris mubility so patient will have a negative Obers test and improved ability to laterally sway hips durign gait for increase gait mechanics.  PT Short Term Goal 4: patient will be able to squat to 100 degrees of knee flexion without heels coming off floor to be able to lift child off of a stool withotu pain >2/10 PT Long Term Goals Time to Complete Long Term Goals: 8 weeks PT Long Term Goal 1: Patient will be able to squat to floor with feet flat to pick up child from floor without knee pain PT Long Term Goal 1 - Progress: Progressing toward goal PT Long Term Goal 2: Patient will be able to ambualte up a flight of stairs without knee pain PT Long Term Goal 2 - Progress: Progressing toward goal  Problem List Patient Active Problem List   Diagnosis Date Noted  . Pain in joint, lower leg 06/15/2013  . 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 SVD (spontaneous vaginal delivery) 02/14/2012    PT - End of Session Equipment Utilized During Treatment: Gait belt Activity Tolerance: Patient tolerated treatment well PT Plan of Care PT Home Exercise Plan: Hamstring, gastroc, hip flexor, groin, and piriformis stretch.  PT Patient Instructions: 3sec hold, 10x multidirectional.  Consulted and Agree with Plan of Care: Patient  GP    Leia Alf 06/15/2013, 6:59 PM  Physician Documentation Your signature is required to indicate approval of the treatment plan as stated  above.  Please sign and either send electronically or make a copy of this  report for your files and return this physician signed original.   Please mark one 1.__approve of plan  2. ___approve of plan with the following conditions.   ______________________________                                                          _____________________ Physician Signature                                                                                                             Date

## 2013-07-26 ENCOUNTER — Ambulatory Visit
Admission: RE | Admit: 2013-07-26 | Discharge: 2013-07-26 | Disposition: A | Discharge status: Home / Self Care | Payer: 59 | Source: Ambulatory Visit | Attending: Family Medicine | Admitting: Family Medicine

## 2013-07-26 ENCOUNTER — Ambulatory Visit: Payer: 59

## 2013-07-26 ENCOUNTER — Ambulatory Visit (INDEPENDENT_AMBULATORY_CARE_PROVIDER_SITE_OTHER): Payer: 59 | Admitting: Family Medicine

## 2013-07-26 ENCOUNTER — Telehealth: Payer: Self-pay

## 2013-07-26 VITALS — BP 122/88 | HR 97 | Temp 98.2°F | Resp 16 | Ht 64.5 in | Wt 165.6 lb

## 2013-07-26 DIAGNOSIS — R935 Abnormal findings on diagnostic imaging of other abdominal regions, including retroperitoneum: Secondary | ICD-10-CM

## 2013-07-26 DIAGNOSIS — G8929 Other chronic pain: Secondary | ICD-10-CM

## 2013-07-26 DIAGNOSIS — K5900 Constipation, unspecified: Secondary | ICD-10-CM

## 2013-07-26 DIAGNOSIS — R82998 Other abnormal findings in urine: Secondary | ICD-10-CM

## 2013-07-26 DIAGNOSIS — R1031 Right lower quadrant pain: Secondary | ICD-10-CM

## 2013-07-26 DIAGNOSIS — R8290 Unspecified abnormal findings in urine: Secondary | ICD-10-CM

## 2013-07-26 DIAGNOSIS — N898 Other specified noninflammatory disorders of vagina: Secondary | ICD-10-CM

## 2013-07-26 LAB — POCT UA - MICROSCOPIC ONLY
Casts, Ur, LPF, POC: NEGATIVE
Crystals, Ur, HPF, POC: NEGATIVE
Mucus, UA: NEGATIVE
RBC, urine, microscopic: NEGATIVE
Yeast, UA: NEGATIVE

## 2013-07-26 LAB — COMPREHENSIVE METABOLIC PANEL WITH GFR
Albumin: 4.4 g/dL (ref 3.5–5.2)
BUN: 9 mg/dL (ref 6–23)
Calcium: 9 mg/dL (ref 8.4–10.5)
Chloride: 104 meq/L (ref 96–112)
Creat: 0.87 mg/dL (ref 0.50–1.10)

## 2013-07-26 LAB — POCT CBC
Granulocyte percent: 69.1 %G (ref 37–80)
HCT, POC: 40.9 % (ref 37.7–47.9)
Hemoglobin: 13.3 g/dL (ref 12.2–16.2)
Lymph, poc: 1.9 (ref 0.6–3.4)
MCH, POC: 29 pg (ref 27–31.2)
MCHC: 32.5 g/dL (ref 31.8–35.4)
MCV: 89.3 fL (ref 80–97)
MID (cbc): 0.3 (ref 0–0.9)
MPV: 8.7 fL (ref 0–99.8)
POC Granulocyte: 5 (ref 2–6.9)
POC LYMPH PERCENT: 26.4 % (ref 10–50)
POC MID %: 4.5 %M (ref 0–12)
Platelet Count, POC: 285 10*3/uL (ref 142–424)
RBC: 4.58 M/uL (ref 4.04–5.48)
RDW, POC: 13.6 %
WBC: 7.3 10*3/uL (ref 4.6–10.2)

## 2013-07-26 LAB — POCT URINALYSIS DIPSTICK
Bilirubin, UA: NEGATIVE
Blood, UA: NEGATIVE
Glucose, UA: NEGATIVE
Ketones, UA: NEGATIVE
Nitrite, UA: NEGATIVE
Protein, UA: NEGATIVE
Spec Grav, UA: 1.02
Urobilinogen, UA: 0.2
pH, UA: 6

## 2013-07-26 LAB — COMPREHENSIVE METABOLIC PANEL
ALT: 9 U/L (ref 0–35)
AST: 16 U/L (ref 0–37)
Alkaline Phosphatase: 53 U/L (ref 39–117)
CO2: 27 mEq/L (ref 19–32)
Glucose, Bld: 95 mg/dL (ref 70–99)
Potassium: 4.3 mEq/L (ref 3.5–5.3)
Sodium: 139 mEq/L (ref 135–145)
Total Bilirubin: 0.6 mg/dL (ref 0.2–1.2)
Total Protein: 6.9 g/dL (ref 6.0–8.3)

## 2013-07-26 LAB — POCT WET PREP WITH KOH
KOH Prep POC: NEGATIVE
RBC Wet Prep HPF POC: NEGATIVE
Trichomonas, UA: NEGATIVE
Yeast Wet Prep HPF POC: NEGATIVE

## 2013-07-26 LAB — POCT URINE PREGNANCY: Preg Test, Ur: NEGATIVE

## 2013-07-26 MED ORDER — IOHEXOL 300 MG/ML  SOLN
100.0000 mL | Freq: Once | INTRAMUSCULAR | Status: AC | PRN
  Administered 2013-07-26: 100 mL via INTRAVENOUS

## 2013-07-26 NOTE — Patient Instructions (Addendum)
Abdominal Pain, Adult Notification number from [facility] is [place] [id number] valid for 45 days/  You may go to [place] for the scan, [place]. Drink the contrast at 3:50 then go to imaging center to drink 2nd bottle, your scan is at 5:50.       Many things can cause abdominal pain. Usually, abdominal pain is not caused by a disease and will improve without treatment. It can often be observed and treated at home. Your health care provider will do a physical exam and possibly order blood tests and X-rays to help determine the seriousness of your pain. However, in many cases, more time must pass before a clear cause of the pain can be found. Before that point, your health care provider may not know if you need more testing or further treatment. HOME CARE INSTRUCTIONS  Monitor your abdominal pain for any changes. The following actions may help to alleviate any discomfort you are experiencing:  Only take over-the-counter or prescription medicines as directed by your health care provider.  Do not take laxatives unless directed to do so by your health care provider.  Try a clear liquid diet (broth, tea, or water) as directed by your health care provider. Slowly move to a bland diet as tolerated. SEEK MEDICAL CARE IF:  You have unexplained abdominal pain.  You have abdominal pain associated with nausea or diarrhea.  You have pain when you urinate or have a bowel movement.  You experience abdominal pain that wakes you in the night.  You have abdominal pain that is worsened or improved by eating food.  You have abdominal pain that is worsened with eating fatty foods. SEEK IMMEDIATE MEDICAL CARE IF:   Your pain does not go away within 2 hours.  You have a fever.  You keep throwing up (vomiting).  Your pain is felt only in portions of the abdomen, such as the right side or the left lower portion of the abdomen.  You pass bloody or black tarry stools. MAKE SURE  YOU:  Understand these instructions.   Will watch your condition.   Will get help right away if you are not doing well or get worse.  Document Released: 11/27/2004 Document Revised: 12/08/2012 Document Reviewed: 10/27/2012 [facility] Patient Information 2014 [place].

## 2013-07-26 NOTE — Progress Notes (Signed)
Patient sent for CT scan the notification from [facility] will be valid for 45 days the notification number is [phone], the scan will be done today at 5:50, patient only has one bottle of contrast, will drink at 3:50 then go to [place] at 4:50 to get second bottle and to have the scan

## 2013-07-26 NOTE — Telephone Encounter (Signed)
Spoke with patient. I will give her not to be out till ThursdayWill give her CT results when it is read. No narcotics for now.

## 2013-07-26 NOTE — Telephone Encounter (Signed)
Patient was sent for an ultrasound by Dr Marin Comment. Still in lots of pain. Pain in lower right quadrant. Scheduled to work tomorrow but not sure what she needs to do. Cb# [phone]

## 2013-07-26 NOTE — Progress Notes (Signed)
Chief Complaint:  Chief Complaint  Patient presents with  . Abdominal Pain    x 2 weeks     HPI: Terri Wright is a 23 y.o. female who is here for  Left sided and right sided radiating pain that crosses entire abd, sharp shooting pain x 2 weeks, last fors  30 sec. Each time Last epsiode of pain x 2 weeks ago, 10/10 pain where she can't sit comfortably , cannot move Has had appendix taken out in 2008, has a history of Redundant colon she feels it and knows it Last BM was last Wednesday Last BM 7-14, does not take anything for it  Miralax , otc laxatives, and high fiber diet did not help She has had some nausea, no urinary sxs , no h.o STDs, has not been sexually active for over 2 years,last pap with daughter was normal    PELVIS CT WITH CONTRAST:  Technique: Multidetector CT imaging of the pelvis was performed following the standard protocol during bolus administration of intravenous contrast.  Findings: The patient appears to have undergone interval appendectomy. A moderate amount of stool is present throughout the colon. Tubular low-density structure in the right adnexa measures 4.3 x 2.2 cm transverse on image 76 and appears slightly more prominent than it did previously. Although possibly an elongated ovarian cyst, this is suggestive of a right-sided hydrosalpinx. The left adnexa and uterus appear stable.  IMPRESSION:  1. 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 This could be further evaluated with pelvic ultrasound if clinically warranted.  2. No other acute pelvic findings. Probable constipation.  HA snpt been sexually active since had daugter, 17 month. No STD  No ovarian cyst.        Clinical Data: 23 year old female with right lower quadrant pain starting this morning. Query appendicitis.  ABDOMEN CT WITH CONTRAST:  Technique: Multidetector CT imaging of the abdomen was performed following the standard protocol during bolus administration of intravenous contrast.    Contrast: 100 cc Omnipaque 300  Comparison: 08/01/04  Findings: Stable slight thickening of the right major fissure laterally. Stable other subpleural probable scarring at the right lung base laterally. These may be post infectious. Lungs are otherwise clear. No free air. Visualized mediastinal structures are normal. Normal liver, gallbladder, spleen, pancreas, adrenal glands, and kidneys. Major vascular structures are patent including the portal venous system. Gastric distension is less than on the prior exam. Proximal small bowel loops are normal. Distension and redundancy of the transverse colon is similar to the prior exam. There is retained stool at both flexures No adenopathy. Small chronic fracture of the right L1 transverse process. Other visualized osseous structures are normal.  IMPRESSION:  1. No acute findings in the abdomen.  2. Colonic distension and redundancy with retained stool similar to the prior exam.  PELVIS CT WITH CONTRAST:  Technique: Multidetector CT imaging of the pelvis was performed following the standard protocol during bolus administration of intravenous contrast.  Findings: Terminal ileum is normally located and the ileocecal valve is within normal limits in the right lower quadrant. The appendix is identified in this region arising from the cecum and courses to the midline with the tip overlying the S1 level. The appendix is noninflamed and contains some gas. The uterus and adnexa are normal for age. The bladder is normal.  There is an abnormal course of the descending and sigmoid colon. The descending colon is seen to cross midline at the level of the aortoiliac bifurcation. The sigmoid  colon then abnormally ascends to the upper abdomen where it becomes dilated and gas filled, then loops back on itself, and descends in the right lower quadrant into the pelvis crossing over the right iliac vessels on image 62 and continuing into the rectum. There is mild mesenteric  congestion/edema on image 47. Mild increased number of mesenteric lymph nodes are also noted. The proximal colon is stool filled. No pelvic free fluid, lymphadenopathy, or osseous lesion.  IMPRESSION:  1. Abnormal course of the descending colon and sigmoid most suggestive of internal hernia. A segment of the sigmoid colon is distended and there is mild associated mesenteric congestion. Less likely, the appearance could reflect a sigmoid volvulus. This may be a relapsing and remitting problem given the colonic distension and abundant stool also noted on the 2006 exam. A barium enema would be helpful to evaluate patency of the sigmoid. Definitive treatment may be surgical.  2. No evidence of acute appendicitis. No other acute findings in the pelvis.  The ordering physician, Dr. Sharilyn Sites was advised by me of the above at 1425 hours.     Past Medical History  Diagnosis Date  . Redundant colon    Past Surgical History  Procedure Laterality Date  . Breast surgery      lumps removed  . Abdominal exploration surgery  2008    with diagnosis of redundant colon  . Breast lumpectomy    . Appendectomy     History   Social History  . Marital Status: Single    Spouse Name: N/A    Number of Children: N/A  . Years of Education: N/A   Social History Main Topics  . Smoking status: Never Smoker   . Smokeless tobacco: Never Used  . Alcohol Use: No  . Drug Use: No  . Sexual Activity: Yes   Other Topics Concern  . None   Social History Narrative  . None   Family History  Problem Relation Age of Onset  . Other Neg Hx   . Depression Mother   . Depression Father   . Hypertension Father   . Cancer Maternal Aunt   . COPD Maternal Grandmother   . Hypertension Maternal Grandmother   . Cancer Paternal Grandmother    No Known Allergies Prior to Admission medications   Not on File     ROS: The patient denies fevers, chills, night sweats, unintentional weight loss, chest pain,  palpitations, wheezing, dyspnea on exertion, dysuria, hematuria, melena, numbness, weakness, or tingling.   All other systems have been reviewed and were otherwise negative with the exception of those mentioned in the HPI and as above.    PHYSICAL EXAM: Filed Vitals:   07/26/13 1029  BP: 122/88  Pulse: 97  Temp: 98.2 F (36.8 C)  Resp: 16   Filed Vitals:   07/26/13 1029  Height: 5' 4.5" (1.638 m)  Weight: 165 lb 9.6 oz (75.116 kg)   Body mass index is 28 kg/(m^2).  General: Alert, no acute distress HEENT:  Normocephalic, atraumatic, oropharynx patent. EOMI, PERRLA Cardiovascular:  Regular rate and rhythm, no rubs murmurs or gallops.  No Carotid bruits, radial pulse intact. No pedal edema.  Respiratory: Clear to auscultation bilaterally.  No wheezes, rales, or rhonchi.  No cyanosis, no use of accessory musculature GI: No organomegaly, abdomen is soft and +tenderness, left greater than right, positive bowel sounds.  No masses. Skin: No rashes. Neurologic: Facial musculature symmetric. Psychiatric: Patient is appropriate throughout our interaction. Lymphatic: No cervical lymphadenopathy Musculoskeletal:  Gait intact.   LABS: Results for orders placed in visit on 07/26/13  POCT CBC      Result Value Ref Range   WBC 7.3  4.6 - 10.2 K/uL   Lymph, poc 1.9  0.6 - 3.4   POC LYMPH PERCENT 26.4  10 - 50 %L   MID (cbc) 0.3  0 - 0.9   POC MID % 4.5  0 - 12 %M   POC Granulocyte 5.0  2 - 6.9   Granulocyte percent 69.1  37 - 80 %G   RBC 4.58  4.04 - 5.48 M/uL   Hemoglobin 13.3  12.2 - 16.2 g/dL   HCT, POC 40.9  37.7 - 47.9 %   MCV 89.3  80 - 97 fL   MCH, POC 29.0  27 - 31.2 pg   MCHC 32.5  31.8 - 35.4 g/dL   RDW, POC 13.6     Platelet Count, POC 285  142 - 424 K/uL   MPV 8.7  0 - 99.8 fL  POCT URINALYSIS DIPSTICK      Result Value Ref Range   Color, UA yellow     Clarity, UA clear     Glucose, UA neg     Bilirubin, UA neg     Ketones, UA neg     Spec Grav, UA 1.020      Blood, UA neg     pH, UA 6.0     Protein, UA neg     Urobilinogen, UA 0.2     Nitrite, UA neg     Leukocytes, UA Trace    POCT UA - MICROSCOPIC ONLY      Result Value Ref Range   WBC, Ur, HPF, POC 0-4     RBC, urine, microscopic neg     Bacteria, U Microscopic trace     Mucus, UA neg     Epithelial cells, urine per micros 0-2     Crystals, Ur, HPF, POC neg     Casts, Ur, LPF, POC neg     Yeast, UA neg    POCT URINE PREGNANCY      Result Value Ref Range   Preg Test, Ur Negative    POCT WET PREP WITH KOH      Result Value Ref Range   Trichomonas, UA Negative     Clue Cells Wet Prep HPF POC meg     Epithelial Wet Prep HPF POC 6-10     Yeast Wet Prep HPF POC neg     Bacteria Wet Prep HPF POC 1+     RBC Wet Prep HPF POC neg     WBC Wet Prep HPF POC 20-25     KOH Prep POC Negative       EKG/XRAY:   Primary read interpreted by Dr. Marin Comment at [facility]. No acute cardiopulmonary process Please comment if there is obstruction + LUQ dilation   ASSESSMENT/PLAN: Encounter Diagnoses  Name Primary?  . Abdominal pain, chronic, right lower quadrant Yes  . Abnormal abdominal x-ray   . Vaginal discharge   . Abnormal urine    23 y/o femal with 10/10 RLQ and LLQ abd pain,  radiating pain, unable to move, has had this for 2 weeks off and on. Most likely constipation , has tried miralax and such in the past without success, official xray ( results below)  was abnormal for possible obstruction. Recommended CT scan w/contrast. D/w patient and she agrees If no obstruction and just  chronic contstipation problem  then will rx linzess or amitiza Labs pending   IMPRESSION:  1. No acute cardiopulmonary process  2. Moderate to severe fecal retention. Focally abnormally distended  left upper quadrant large bowel, but without more proximal  dilatation and with gas seen distal to this level. Although  certainly not specific for obstruction, a focal low grade partial  large bowel obstruction is not  excluded at the level of the splenic  flexure. Consider further evaluation with endoscopy, CT scan, or  barium enema.  Electronically Signed  By: Skipper Cliche M.D.  On: 07/26/2013 11:45   Gross sideeffects, risk and benefits, and alternatives of medications d/w patient. Patient is aware that all medications have potential sideeffects and we are unable to predict every sideeffect or drug-drug interaction that may occur.  Glenford Bayley, DO 07/26/2013 12:27 PM  07/26/13 Spoke with patient about CT results. CT results show significant stool burden with gas.  Will send in [place]. She has tried otc meds without relief in the past.She has tried miralax, stool softeners and laxatives in the past without relief. This apparently has been an issue for her for a while.

## 2013-07-27 MED ORDER — LUBIPROSTONE 24 MCG PO CAPS: 24.0000 ug | ORAL_CAPSULE | Freq: Two times a day (BID) | ORAL | Status: DC

## 2013-07-27 NOTE — Telephone Encounter (Signed)
Patient called and wants to make sure her note will be ready today so that her sister Terri Wright) can pick it up. Thank you.

## 2013-07-27 NOTE — Telephone Encounter (Signed)
Advised pt letter ready for pu

## 2013-07-28 LAB — URINE CULTURE: Colony Count: 65000

## 2013-07-29 LAB — GC/CHLAMYDIA PROBE AMP
CT Probe RNA: NEGATIVE
GC Probe RNA: NEGATIVE

## 2013-08-04 ENCOUNTER — Ambulatory Visit: Payer: 59 | Admitting: Orthopedic Surgery

## 2013-08-16 ENCOUNTER — Telehealth: Payer: Self-pay

## 2013-08-16 NOTE — Telephone Encounter (Signed)
PATIENT STATES SHE SAW DR. LE ABOUT 3 WEEKS AGO FOR ABDOMINAL PAIN. HER TEST CAME BACK SHOWING THAT SHE HAD A GAS BUBBLE BLOCKING HER COLON AND A KIDNEY STONE. SHE WOULD LIKE TO KNOW HOW LONG IT TAKES FOR A KIDNEY STONE TO PASS? SHE HAS HAD PAIN ALL DAY TODAY. BEST PHONE [phone] (CELL)  Terri Wright

## 2013-08-17 NOTE — Telephone Encounter (Signed)
LM to call back if she wants referral to urology if similar pain as before. She can also have constipation , if so needs to take miralax. If having fevers, chills, n/v/blood in urine then need to f/u  in office or go to ER.

## 2018-07-05 ENCOUNTER — Ambulatory Visit: Payer: Self-pay | Admitting: Family

## 2019-06-30 ENCOUNTER — Other Ambulatory Visit: Payer: Self-pay | Admitting: Obstetrics and Gynecology

## 2019-06-30 DIAGNOSIS — N6320 Unspecified lump in the left breast, unspecified quadrant: Secondary | ICD-10-CM

## 2019-07-22 ENCOUNTER — Other Ambulatory Visit: Payer: Self-pay

## 2019-07-22 ENCOUNTER — Ambulatory Visit
Admission: RE | Admit: 2019-07-22 | Discharge: 2019-07-22 | Disposition: A | Discharge status: Home / Self Care | Payer: BC Managed Care – PPO | Source: Ambulatory Visit | Attending: Obstetrics and Gynecology | Admitting: Obstetrics and Gynecology

## 2019-07-22 ENCOUNTER — Other Ambulatory Visit: Payer: Self-pay | Admitting: Obstetrics and Gynecology

## 2019-07-22 DIAGNOSIS — N6320 Unspecified lump in the left breast, unspecified quadrant: Secondary | ICD-10-CM

## 2019-07-22 DIAGNOSIS — D242 Benign neoplasm of left breast: Secondary | ICD-10-CM

## 2019-07-25 ENCOUNTER — Ambulatory Visit: Payer: BC Managed Care – PPO | Admitting: Nutrition

## 2019-09-12 ENCOUNTER — Other Ambulatory Visit: Payer: Self-pay | Admitting: Obstetrics and Gynecology

## 2019-09-12 DIAGNOSIS — N6313 Unspecified lump in the right breast, lower outer quadrant: Secondary | ICD-10-CM

## 2019-09-23 ENCOUNTER — Other Ambulatory Visit: Payer: BC Managed Care – PPO

## 2019-09-30 ENCOUNTER — Other Ambulatory Visit: Payer: Self-pay | Admitting: Obstetrics and Gynecology

## 2019-09-30 ENCOUNTER — Ambulatory Visit
Admission: RE | Admit: 2019-09-30 | Discharge: 2019-09-30 | Disposition: A | Discharge status: Home / Self Care | Payer: BC Managed Care – PPO | Source: Ambulatory Visit | Attending: Obstetrics and Gynecology | Admitting: Obstetrics and Gynecology

## 2019-09-30 ENCOUNTER — Other Ambulatory Visit: Payer: Self-pay

## 2019-09-30 DIAGNOSIS — N6313 Unspecified lump in the right breast, lower outer quadrant: Secondary | ICD-10-CM

## 2020-02-03 ENCOUNTER — Other Ambulatory Visit: Payer: BC Managed Care – PPO

## 2020-04-06 ENCOUNTER — Ambulatory Visit
Admission: RE | Admit: 2020-04-06 | Discharge: 2020-04-06 | Disposition: A | Discharge status: Home / Self Care | Payer: BC Managed Care – PPO | Source: Ambulatory Visit | Attending: Obstetrics and Gynecology | Admitting: Obstetrics and Gynecology

## 2020-04-06 ENCOUNTER — Other Ambulatory Visit: Payer: Self-pay

## 2020-04-06 DIAGNOSIS — N6313 Unspecified lump in the right breast, lower outer quadrant: Secondary | ICD-10-CM

## 2020-04-06 DIAGNOSIS — D242 Benign neoplasm of left breast: Secondary | ICD-10-CM

## 2022-02-14 IMAGING — US US BREAST*L* LIMITED INC AXILLA
1 series · 5 of 5 positions shown · non-contrast
Comparison: None.

CLINICAL DATA: 28-year-old female with palpable UPPER LEFT breast
mass discovered on self-examination. History of prior LEFT
fibroadenomas 10 years ago which were surgically removed.

EXAM:
ULTRASOUND OF THE LEFT BREAST

[Series 1: us breast*left* limited inc axilla · 0.07mm/px · 5 of 5 slices shown]
[im 1/5]
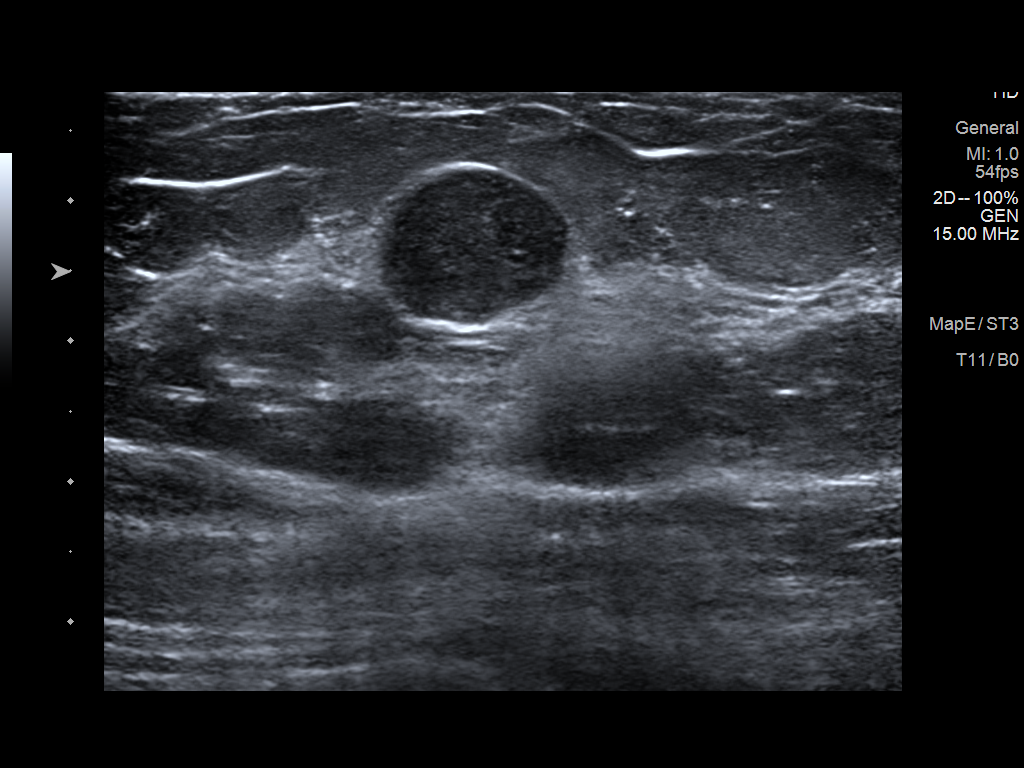
[im 2/5]
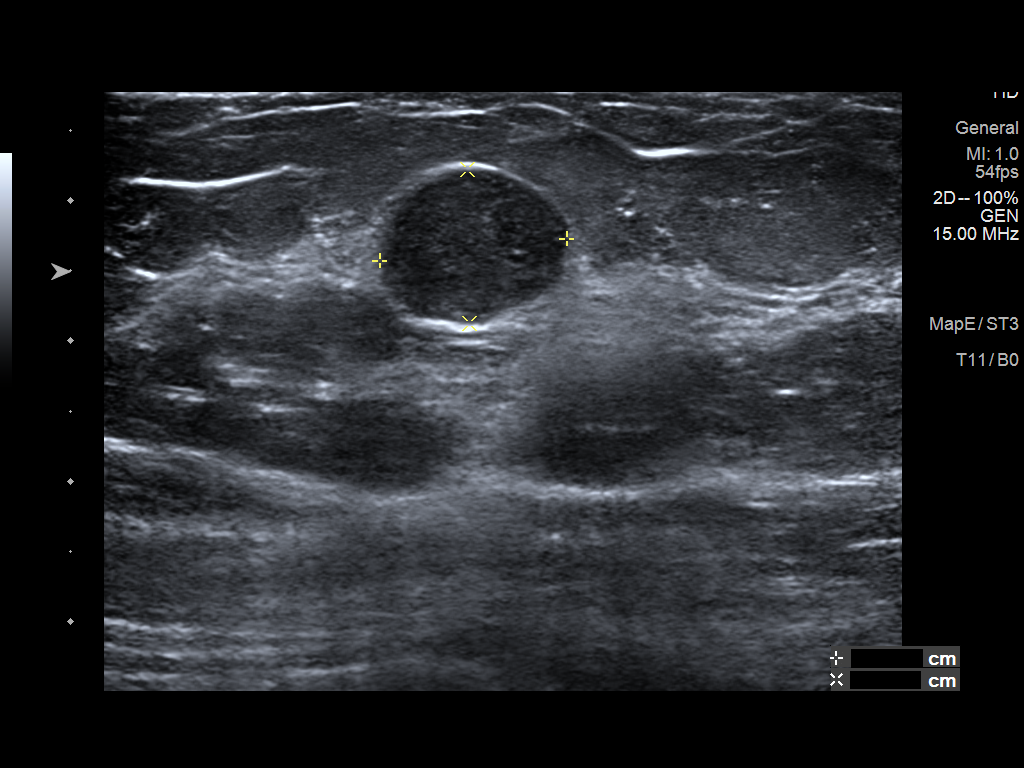
[im 3/5]
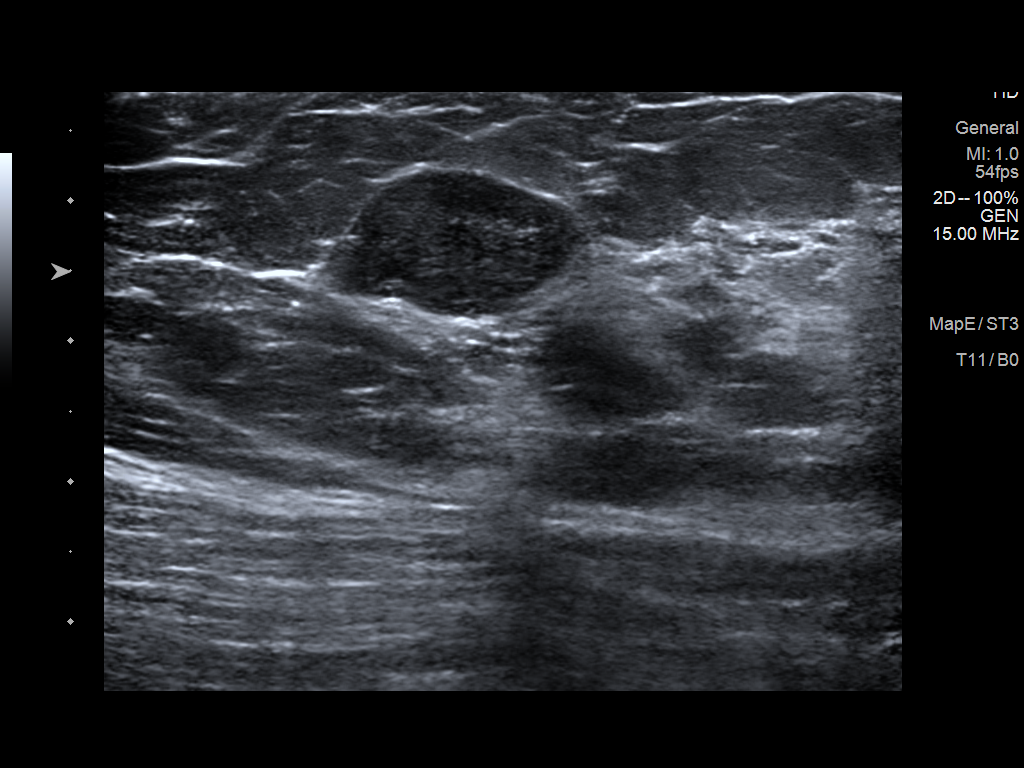
[im 4/5]
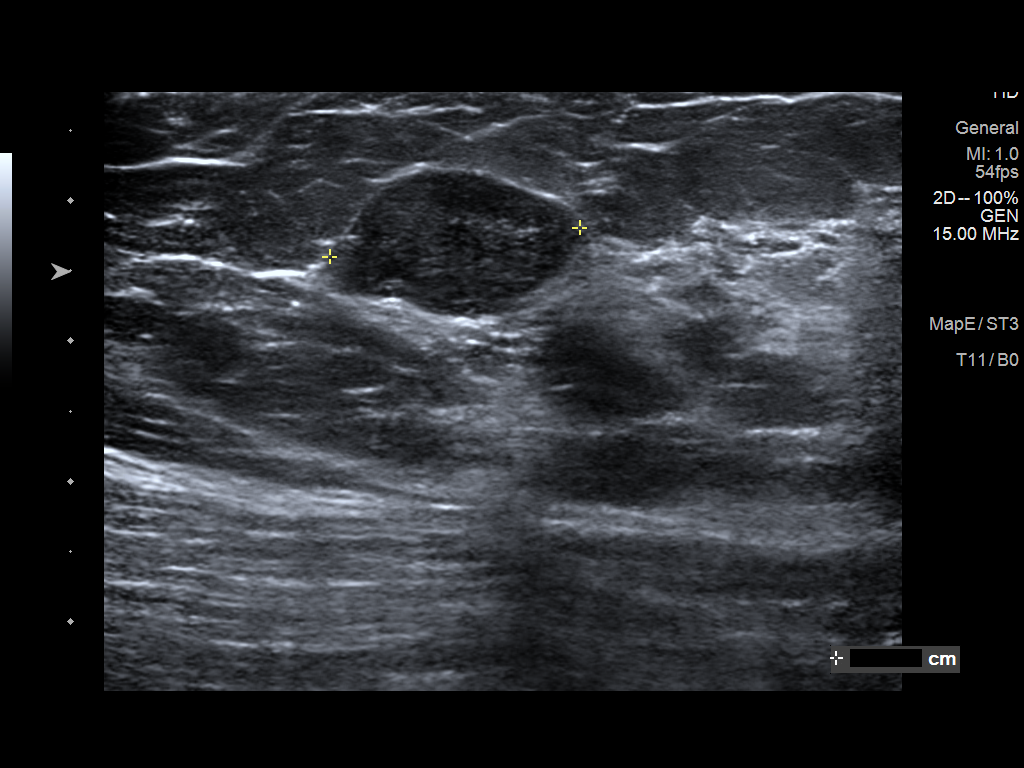
[im 5/5]
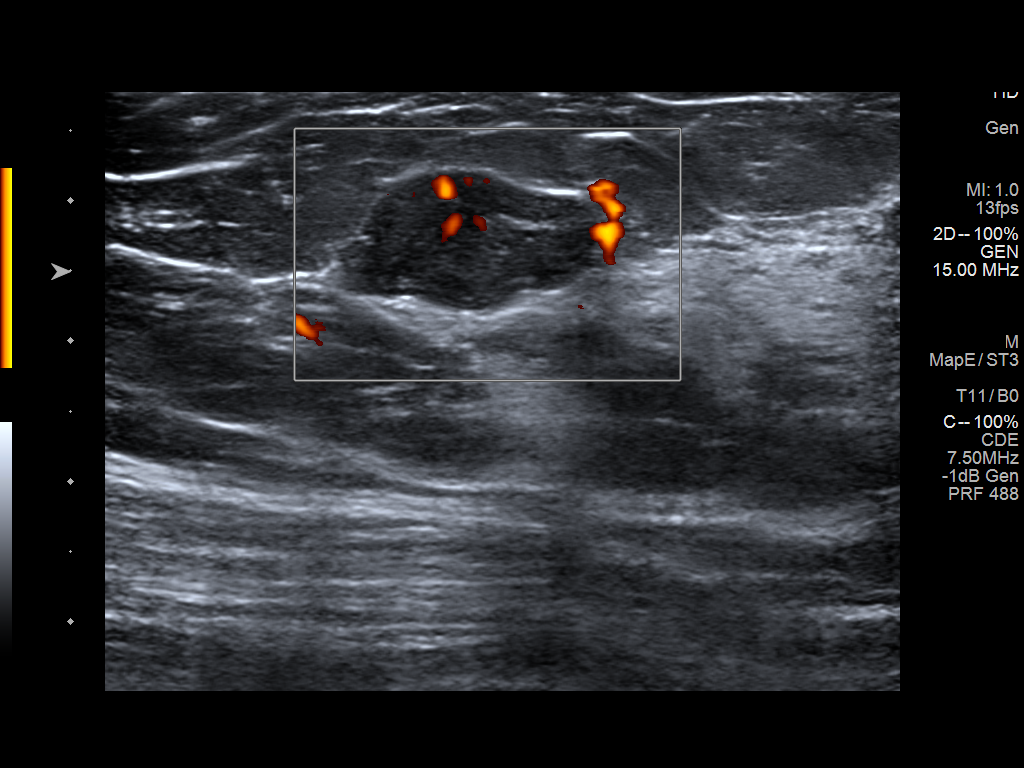

[5 of 5 positions shown; findings below may reference images not displayed]

FINDINGS: On physical exam, a firm mobile mass is identified at the [DATE]
position of the LEFT breast 5 cm from the nipple.

Targeted ultrasound is performed, showing a 1.3 x 1.1 x 1.8 cm
circumscribed oval hypoechoic parallel mass at the [DATE] position of
the LEFT breast 5 cm from the nipple. This likely represents a
fibroadenoma and corresponds to the patient's palpable abnormality.
IMPRESSION: 1.3 x 1.1 x 1.8 cm probable fibroadenoma within the UPPER LEFT
breast. We discussed management options including excision,
ultrasound-guided core biopsy, and close follow-up. Follow-up
ultrasound is recommended at 6, 12,and 24 months to assess
stability. The patient concurs with this plan.

RECOMMENDATION:
LEFT breast ultrasound in 6 months.

I have discussed the findings and recommendations with the patient.
If applicable, a reminder letter will be sent to the patient
regarding the next appointment.

BI-RADS CATEGORY  3: Probably benign.

## 2022-05-21 LAB — HM PAP SMEAR: HPV, high-risk: NEGATIVE

## 2022-08-08 ENCOUNTER — Ambulatory Visit (INDEPENDENT_AMBULATORY_CARE_PROVIDER_SITE_OTHER): Payer: 59 | Admitting: Family Medicine

## 2022-08-08 ENCOUNTER — Encounter: Payer: Self-pay | Admitting: Family Medicine

## 2022-08-08 VITALS — BP 118/88 | HR 83 | Temp 97.9°F | Ht 64.5 in | Wt 206.0 lb

## 2022-08-08 DIAGNOSIS — R030 Elevated blood-pressure reading, without diagnosis of hypertension: Secondary | ICD-10-CM | POA: Diagnosis not present

## 2022-08-08 DIAGNOSIS — F4321 Adjustment disorder with depressed mood: Secondary | ICD-10-CM | POA: Diagnosis not present

## 2022-08-08 DIAGNOSIS — R61 Generalized hyperhidrosis: Secondary | ICD-10-CM | POA: Diagnosis not present

## 2022-08-08 DIAGNOSIS — N644 Mastodynia: Secondary | ICD-10-CM

## 2022-08-08 LAB — COMPREHENSIVE METABOLIC PANEL
ALT: 24 U/L (ref 0–35)
AST: 21 U/L (ref 0–37)
Albumin: 4.2 g/dL (ref 3.5–5.2)
Alkaline Phosphatase: 64 U/L (ref 39–117)
BUN: 11 mg/dL (ref 6–23)
CO2: 24 mEq/L (ref 19–32)
Calcium: 9 mg/dL (ref 8.4–10.5)
Chloride: 107 mEq/L (ref 96–112)
Creatinine, Ser: 1.05 mg/dL (ref 0.40–1.20)
GFR: 70.12 mL/min (ref >60.00)
Glucose, Bld: 88 mg/dL (ref 70–99)
Potassium: 4.3 mEq/L (ref 3.5–5.1)
Sodium: 140 mEq/L (ref 135–145)
Total Bilirubin: 0.6 mg/dL (ref 0.2–1.2)
Total Protein: 6.8 g/dL (ref 6.0–8.3)

## 2022-08-08 LAB — CBC WITH DIFFERENTIAL/PLATELET
Basophils Absolute: 0 10*3/uL (ref 0.0–0.1)
Basophils Relative: 0.4 % (ref 0.0–3.0)
Eosinophils Absolute: 0.1 10*3/uL (ref 0.0–0.7)
Eosinophils Relative: 2.2 % (ref 0.0–5.0)
HCT: 39.4 % (ref 36.0–46.0)
Hemoglobin: 13.2 g/dL (ref 12.0–15.0)
Lymphocytes Relative: 38.8 % (ref 12.0–46.0)
Lymphs Abs: 2.7 10*3/uL (ref 0.7–4.0)
MCHC: 33.6 g/dL (ref 30.0–36.0)
MCV: 87.2 fl (ref 78.0–100.0)
Monocytes Absolute: 0.4 10*3/uL (ref 0.1–1.0)
Monocytes Relative: 5.5 % (ref 3.0–12.0)
Neutro Abs: 3.7 10*3/uL (ref 1.4–7.7)
Neutrophils Relative %: 53.1 % (ref 43.0–77.0)
Platelets: 259 10*3/uL (ref 150.0–400.0)
RBC: 4.52 Mil/uL (ref 3.87–5.11)
RDW: 13.1 % (ref 11.5–15.5)
WBC: 6.9 10*3/uL (ref 4.0–10.5)

## 2022-08-08 LAB — TSH: TSH: 1.33 u[IU]/mL (ref 0.35–5.50)

## 2022-08-08 LAB — HEMOGLOBIN A1C: Hgb A1c MFr Bld: 4.7 % (ref 4.6–6.5)

## 2022-08-08 MED ORDER — ESCITALOPRAM OXALATE 10 MG PO TABS
10.0000 mg | ORAL_TABLET | Freq: Every day | ORAL | 1 refills | Status: DC
Start: 2022-08-08 — End: 2022-10-24

## 2022-08-08 NOTE — Progress Notes (Unsigned)
New Patient Office Visit  Subjective:  Patient ID: Terri Wright, female    DOB: 10/03/1990  Age: 32 y.o. MRN: [id number]  CC:  Chief Complaint  Patient presents with   New Patient (Initial Visit)   Check thyroid   Discuss painful breast cysts    Would like a second opinion.    HPI Terri Wright presents for new patient.  Thyroid  Breast cysts-can be painful.  Shooting up to axilla. Last studies done 2022.  Blood pressure's have been high Gaining weight, sweats at times.  FH thyroid-mom recommend getting Primary Care Provider (PCP).  Has tried exercise/diet.   Walking daily.  Stays active.  Chickens, etc.     Current Outpatient Medications:    Cephalexin 500 MG tablet, 1 tablet Orally twice a day for 7 days, Disp: , Rfl:    cetirizine (ALL DAY ALLERGY) 10 MG tablet, Take 10 mg by mouth at bedtime., Disp: , Rfl:    escitalopram (LEXAPRO) 10 MG tablet, Take 1 tablet (10 mg total) by mouth daily., Disp: 30 tablet, Rfl: 1   Norethin-Eth Estrad-Fe Biphas (LO LOESTRIN FE PO), Take 1 tablet by mouth at bedtime., Disp: , Rfl:   Past Medical History:  Diagnosis Date   Cancer (HCC) 8/23   Skin cancer   Redundant colon     Past Surgical History:  Procedure Laterality Date   ABDOMINAL EXPLORATION SURGERY  2008   with diagnosis of redundant colon   APPENDECTOMY     BREAST EXCISIONAL BIOPSY Left 2010   BREAST EXCISIONAL BIOPSY Right 2010   BREAST SURGERY     lumps removed   skin cancer removal     12/2021 for breast and 2024 right leg 08/2022    Family History  Problem Relation Age of Onset   Anxiety disorder Mother    Hypertension Mother    Cancer Mother    Depression Mother    Breast cancer Mother 60   Cancer Father    Depression Father    Hypertension Father    Anxiety disorder Brother    Early death Brother        from motor vehicle accident   Cancer Maternal Aunt    COPD Maternal Grandmother    Hypertension Maternal Grandmother    Stroke Maternal Grandfather     Cancer Paternal Grandmother    Miscarriages / Stillbirths Paternal Grandmother    Cancer Paternal Grandfather    Other Neg Hx     Social History   Socioeconomic History   Marital status: Single    Spouse name: Not on file   Number of children: 1   Years of education: Not on file   Highest education level: Not on file  Occupational History   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    Comment: [employer]  Tobacco Use   Smoking status: Never   Smokeless tobacco: Never  Vaping Use   Vaping Use: Never used  Substance and Sexual Activity   Alcohol use: Yes    Alcohol/week: 3.0 standard drinks of alcohol    Types: 3 Cans of beer per week   Drug use: Never   Sexual activity: Not Currently    Birth control/protection: Pill  Other Topics Concern   Not on file  Social History Narrative   Not on file   Social Determinants of Health   Financial Resource Strain: Not on file  Food Insecurity: Not on file  Transportation Needs: Not on file  Physical Activity: Not on  file  Stress: Not on file  Social Connections: Not on file  Intimate Partner Violence: Not on file    ROS  ROS: Gen: no fever, chills  Skin: no rash, itching ENT: no ear pain, ear drainage, nasal congestion, rhinorrhea, sinus pressure, sore throat.  Takes zyrtec seasonally    Eyes: no blurry vision, double vision Resp: no cough, wheeze,SOB CV: no CP, palpitations, LE edema,  GI: no heartburn, n/v/d/, chronic constipation and redundent colon so pain intermitt GU: no dysuria, urgency, frequency, hematuria.  Pap March  MSK: no joint pain, myalgias, back pain Neuro: no dizziness, headache, weakness, vertigo-chiro for years and headache(s) resolved.   Psych: no, SI .   A lot of stress, losses in life. FH addiction-so concerns w/medications. If not busy, tearful.  Not sleeping well.  Type "A" personality.    Objective:   Today's Vitals: BP 118/88 (BP Location: Left Arm, Patient Position: Sitting, Cuff Size: Large)    Pulse 83   Temp 97.9 F (36.6 C) (Temporal)   Ht 5' 4.5" (1.638 m)   Wt 206 lb (93.4 kg)   SpO2 100%   BMI 34.81 kg/m   Physical Exam  Gen: WDWN NAD HEENT: NCAT, conjunctiva not injected, sclera nonicteric TM WNL B, OP moist, no exudates  NECK:  supple, no thyromegaly, no nodes, no carotid bruits CARDIAC: RRR, S1S2+, no murmur. DP 2+B LUNGS: CTAB. No wheezes ABDOMEN:  BS+, soft, NTND, No HSM, no masses EXT:  no edema MSK: no gross abnormalities.  NEURO: A&O x3.  CN II-XII intact.  PSYCH: normal mood. Good eye contact   Assessment & Plan:  Breast pain in female -     Ambulatory referral to General Surgery  Hyperhidrosis -     CBC with Differential/Platelet -     Comprehensive metabolic panel -     TSH -     Hemoglobin A1c  Elevated blood pressure reading -     CBC with Differential/Platelet -     Comprehensive metabolic panel -     TSH -     Hemoglobin A1c  Adjustment disorder with depressed mood  Other orders -     Escitalopram Oxalate; Take 1 tablet (10 mg total) by mouth daily.  Dispense: 30 tablet; Refill: 1    Follow-up: Return in about 4 weeks (around 09/05/2022) for mood.   Angelena Sole, MD

## 2022-08-08 NOTE — Patient Instructions (Signed)
Welcome to [facility]! It was a pleasure meeting you today.  As discussed, Please schedule a 1 month follow up visit today.  PLEASE NOTE:  If you had any LAB tests please let us know if you have not heard back within a few days. You may see your results on MyChart before we have a chance to review them but we will give you a call once they are reviewed by us. If we ordered any REFERRALS today, please let us know if you have not heard from their office within the next week.  Let us know through MyChart if you are needing REFILLS, or have your pharmacy send us the request. You can also use MyChart to communicate with me or any office staff.  Please try these tips to maintain a healthy lifestyle:  Eat most of your calories during the day when you are active. Eliminate processed foods including packaged sweets (pies, cakes, cookies), reduce intake of potatoes, white bread, white pasta, and white rice. Look for whole grain options, oat flour or almond flour.  Each meal should contain half fruits/vegetables, one quarter protein, and one quarter carbs (no bigger than a computer mouse).  Cut down on sweet beverages. This includes juice, soda, and sweet tea. Also watch fruit intake, though this is a healthier sweet option, it still contains natural sugar! Limit to 3 servings daily.  Drink at least 1 glass of water with each meal and aim for at least 8 glasses per day  Exercise at least 150 minutes every week.   

## 2022-09-05 ENCOUNTER — Encounter: Payer: Self-pay | Admitting: Family Medicine

## 2022-09-05 ENCOUNTER — Telehealth (INDEPENDENT_AMBULATORY_CARE_PROVIDER_SITE_OTHER): Payer: 59 | Admitting: Family Medicine

## 2022-09-05 DIAGNOSIS — R030 Elevated blood-pressure reading, without diagnosis of hypertension: Secondary | ICD-10-CM

## 2022-09-05 DIAGNOSIS — R1110 Vomiting, unspecified: Secondary | ICD-10-CM

## 2022-09-05 DIAGNOSIS — R42 Dizziness and giddiness: Secondary | ICD-10-CM | POA: Diagnosis not present

## 2022-09-05 DIAGNOSIS — F4321 Adjustment disorder with depressed mood: Secondary | ICD-10-CM | POA: Diagnosis not present

## 2022-09-05 NOTE — Patient Instructions (Signed)

## 2022-09-05 NOTE — Progress Notes (Signed)
MyChart Video Visit Virtual Visit via Video Note   This visit type was conducted w/patient consent. This format is felt to be most appropriate for this patient at this time. Physical exam was limited by quality of the video and audio technology used for the visit. CMA was able to get the patient set up on a video visit.  Patient location: Home. Patient and provider in visit Provider location: Office  I discussed the limitations of evaluation and management by telemedicine and the availability of in person appointments. The patient expressed understanding and agreed to proceed.  Visit Date: 09/05/2022  Today's healthcare provider: Angelena Sole, MD     Subjective:    Patient ID: Terri Wright, female    DOB: October 03, 1990, 32 y.o.   MRN: [id number]  Chief Complaint  Patient presents with   Follow-up    4 week follow-up for moods, started medication     HPI-couldn't see pt.  She could see me  Adjustment disorder w/depressed mood-on lexapro 10mg -pt doesn't think any difference.  No SE, no SI   if not occupied,  gets sad, etc.  Anxious about meds.   Elevated blood pressure-highest 148/94.  Lowest 112/85.  Average 130/90.  Surg office 122/80.  DBP average 85-90 SBP 120's average.   Working on diet.  Still not exercise as much d/t leg healing.  Occ dizzy, lightheaded, room spinning and then vomits(has occurred twice)-6/29 at parents, and at derm office 6/12.     Bp was low but can't recall number on 6/29.  Got dizzy a second time on 6/29 but no vomit the second spell.      Vertigo started June..  The past 2, intense and vomit and can't find ppt factors. No LOC.  OCP continuous cycling so no menses.   HA for long time-seeing chiro and better since.   Didn't get HA after the episodes of vertigo.     Past Medical History:  Diagnosis Date   Cancer (HCC) 8/23   Skin cancer   Redundant colon     Past Surgical History:  Procedure Laterality Date   ABDOMINAL EXPLORATION SURGERY  2008   with  diagnosis of redundant colon   APPENDECTOMY     BREAST EXCISIONAL BIOPSY Left 2010   BREAST EXCISIONAL BIOPSY Right 2010   BREAST SURGERY     lumps removed   skin cancer removal     12/2021 for breast and 2024 right leg 08/2022    Outpatient Medications Prior to Visit  Medication Sig Dispense Refill   cetirizine (ALL DAY ALLERGY) 10 MG tablet Take 10 mg by mouth at bedtime.     escitalopram (LEXAPRO) 10 MG tablet Take 1 tablet (10 mg total) by mouth daily. 30 tablet 1   levonorgestrel-ethinyl estradiol (AVIANE) 0.1-20 MG-MCG tablet      Norethin-Eth Estrad-Fe Biphas (LO LOESTRIN FE PO) Take 1 tablet by mouth at bedtime.     No facility-administered medications prior to visit.    No Known Allergies      Objective:     Physical Exam  Vitals and nursing note reviewed.  Constitutional:      General:  is not in acute distress.    Neurological:     Mental Status: Pt is alert and oriented to person, place, and time.  Psychiatric:        Mood and Affect: Mood normal.  Per Pt, EOMI-no dizziness.  There were no vitals taken for this visit.  Wt Readings from Last  3 Encounters:  08/08/22 206 lb (93.4 kg)  07/26/13 165 lb 9.6 oz (75.1 kg)  06/02/13 164 lb (74.4 kg)       Assessment & Plan:   Problem List Items Addressed This Visit   None Visit Diagnoses     Elevated blood pressure reading    -  Primary   Adjustment disorder with depressed mood       Vertigo       Vomiting in adult          Elevated bp readings-intermitt.  Pt working on diet exercise.  DBP average 85-90.  Will continue lifestyle changes and monitoring for now. Adjustment disorder-as long as active, OK, but if not, gets tearful, etc.  Mutual decision to continue lexapro 10mg  as is and monitor Vertigo w/vomiting-intense-new onset and 2 wks apart.  ?migraine variant, sz, stress(doubt), viral(but not daily), other.  Will check MRI F/u 6 wks.  No orders of the defined types were placed in this  encounter.   I discussed the assessment and treatment plan with the patient. The patient was provided an opportunity to ask questions and all were answered. The patient agreed with the plan and demonstrated an understanding of the instructions.   The patient was advised to call back or seek an in-person evaluation if the symptoms worsen or if the condition fails to improve as anticipated.  Return in about 6 weeks (around 10/17/2022) for bp, moods.  Angelena Sole, MD [facility] [phone] (phone) [phone] (fax)  [facility]

## 2022-09-20 ENCOUNTER — Ambulatory Visit
Admission: RE | Admit: 2022-09-20 | Discharge: 2022-09-20 | Disposition: A | Discharge status: Home / Self Care | Payer: 59 | Source: Ambulatory Visit | Attending: Family Medicine | Admitting: Family Medicine

## 2022-09-20 DIAGNOSIS — R1110 Vomiting, unspecified: Secondary | ICD-10-CM

## 2022-09-20 DIAGNOSIS — R42 Dizziness and giddiness: Secondary | ICD-10-CM

## 2022-10-01 ENCOUNTER — Encounter (INDEPENDENT_AMBULATORY_CARE_PROVIDER_SITE_OTHER): Payer: Self-pay

## 2022-10-17 ENCOUNTER — Ambulatory Visit: Payer: 59 | Admitting: Family Medicine

## 2022-10-24 ENCOUNTER — Encounter: Payer: Self-pay | Admitting: Family Medicine

## 2022-10-24 ENCOUNTER — Ambulatory Visit (INDEPENDENT_AMBULATORY_CARE_PROVIDER_SITE_OTHER): Payer: 59 | Admitting: Family Medicine

## 2022-10-24 VITALS — BP 122/82 | HR 99 | Temp 98.5°F | Resp 18 | Ht 64.5 in | Wt 210.5 lb

## 2022-10-24 DIAGNOSIS — F4321 Adjustment disorder with depressed mood: Secondary | ICD-10-CM

## 2022-10-24 DIAGNOSIS — Z6835 Body mass index (BMI) 35.0-35.9, adult: Secondary | ICD-10-CM

## 2022-10-24 DIAGNOSIS — R030 Elevated blood-pressure reading, without diagnosis of hypertension: Secondary | ICD-10-CM

## 2022-10-24 NOTE — Patient Instructions (Signed)
It was very nice to see you today!  Wegovy, zepbound.   PLEASE NOTE:  If you had any lab tests please let us know if you have not heard back within a few days. You may see your results on MyChart before we have a chance to review them but we will give you a call once they are reviewed by [place]. If we ordered any referrals today, please let us know if you have not heard from their office within the next week.   Please try these tips to maintain a healthy lifestyle:  Eat most of your calories during the day when you are active. Eliminate processed foods including packaged sweets (pies, cakes, cookies), reduce intake of potatoes, white bread, white pasta, and white rice. Look for whole grain options, oat flour or almond flour.  Each meal should contain half fruits/vegetables, one quarter protein, and one quarter carbs (no bigger than a computer mouse).  Cut down on sweet beverages. This includes juice, soda, and sweet tea. Also watch fruit intake, though this is a healthier sweet option, it still contains natural sugar! Limit to 3 servings daily.  Drink at least 1 glass of water with each meal and aim for at least 8 glasses per day  Exercise at least 150 minutes every week.

## 2022-10-24 NOTE — Progress Notes (Signed)
   Subjective:     Patient ID: Terri Wright, female    DOB: 03/23/1990, 32 y.o.   MRN: [id number]  Chief Complaint  Patient presents with   Follow-up    6 week follow-up on moods, stopped taking meds 2 weeks ago, feeling much better since being off, didn't think it was working Concerned with weight gain Discuss MRI results     HPI  Elevated BP - Bp at initial read was 140/99. Bp at recheck was 122/82. Has been checking her Bp at home with readings around 110-120/75-90's.  Weight loss - Patient expressed concerns about her BMI. States she has not been regularly working out due to her previous feelings of depression that prevented her from daily activities. She endorses often skipping meals and eating out regularly.   Depression/Anxiety - Patient states that for 2 months she felt extreme fatigue that prevented her from doing daily activities. Since discontinuing Lexapro, her energy levels have increased. She now can better manage her children, yard work, and home life.   Health Maintenance Due  Topic Date Due   DTaP/Tdap/Td (2 - Td or Tdap) 11/20/2021    Past Medical History:  Diagnosis Date   Cancer (HCC) 8/23   Skin cancer   Redundant colon     Past Surgical History:  Procedure Laterality Date   ABDOMINAL EXPLORATION SURGERY  2008   with diagnosis of redundant colon   APPENDECTOMY     BREAST EXCISIONAL BIOPSY Left 2010   BREAST EXCISIONAL BIOPSY Right 2010   BREAST SURGERY     lumps removed   skin cancer removal     12/2021 for breast and 2024 right leg 08/2022     Current Outpatient Medications:    cetirizine (ALL DAY ALLERGY) 10 MG tablet, Take 10 mg by mouth at bedtime., Disp: , Rfl:    levonorgestrel-ethinyl estradiol (AVIANE) 0.1-20 MG-MCG tablet, , Disp: , Rfl:   No Known Allergies ROS neg/noncontributory except as noted HPI/below      Objective:     BP 122/82 (BP Location: Left Arm, Patient Position: Sitting, Cuff Size: Large)   Pulse 99   Temp  98.5 F (36.9 C) (Temporal)   Resp 18   Ht 5' 4.5" (1.638 m)   Wt 210 lb 8 oz (95.5 kg)   SpO2 99%   BMI 35.57 kg/m  Wt Readings from Last 3 Encounters:  10/24/22 210 lb 8 oz (95.5 kg)  08/08/22 206 lb (93.4 kg)  07/26/13 165 lb 9.6 oz (75.1 kg)    Physical Exam   Gen: WDWN NAD HEENT: NCAT, conjunctiva not injected, sclera nonicteric NECK:  supple, no thyromegaly, no nodes, no carotid bruits CARDIAC: RRR, no murmur.  LUNGS: CTAB. No wheezes EXT:  no edema MSK: no gross abnormalities.  NEURO: A&O x3.  CN II-XII intact.  PSYCH: normal mood. Good eye contact     Assessment & Plan:  Adjustment disorder with depressed mood    Return in about 3 months (around 01/24/2023).  Melina Fiddler N Rice,acting as a scribe for Angelena Sole, MD.,have documented all relevant documentation on the behalf of Angelena Sole, MD,as directed by  Angelena Sole, MD while in the presence of Angelena Sole, MD.  Juleen Starr, have reviewed all documentation for this visit. The documentation on 10/24/22 for the exam, diagnosis, procedures, and orders are all accurate and complete. ***   Anaiya N Rice

## 2022-10-28 ENCOUNTER — Encounter: Payer: Self-pay | Admitting: Family Medicine

## 2022-10-31 IMAGING — US US BREAST*R* LIMITED INC AXILLA
1 series · 13 of 25 positions shown · non-contrast
Comparison: Previous exam(s).

CLINICAL DATA: Short-term follow-up for probably benign bilateral
breast masses, the left evaluated with ultrasound on 07/22/2019 and
the right assessed on 09/30/2019 at which time the patient underwent
bilateral diagnostic mammography. She also has a history of 2
fibroadenomas removed from the left breast in 4444, removed due to
an interval increase in size.

EXAM:
ULTRASOUND OF THE BILATERAL BREAST

[Series 1: us breast*right* limited inc axilla · 0.07mm/px · 13 of 25 slices shown]
[im 1/25]
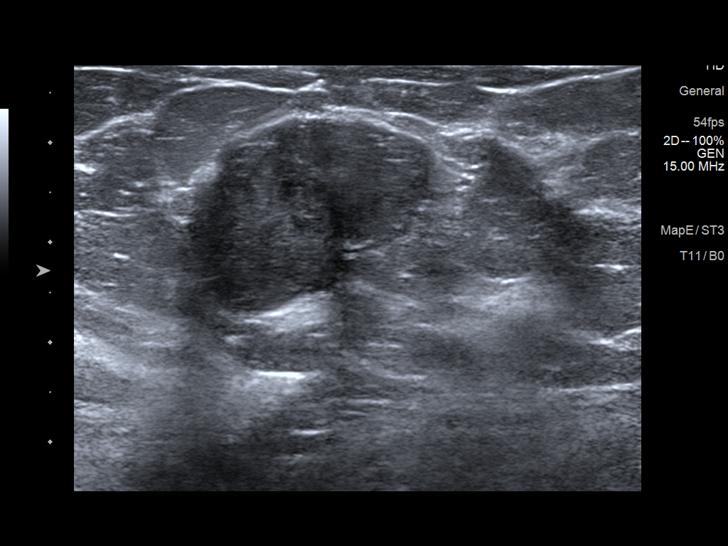
[im 3/25]
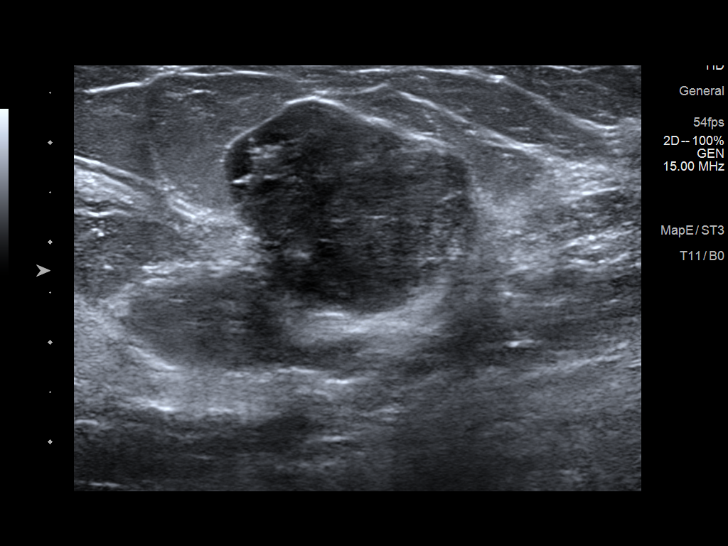
[im 5/25]
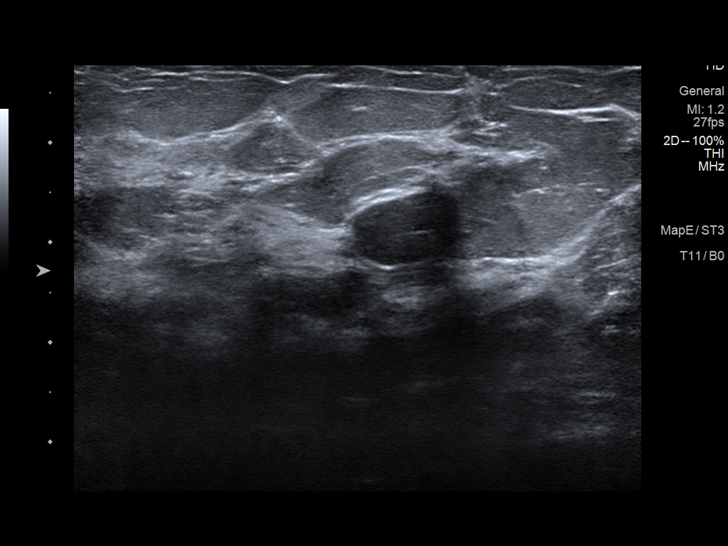
[im 7/25]
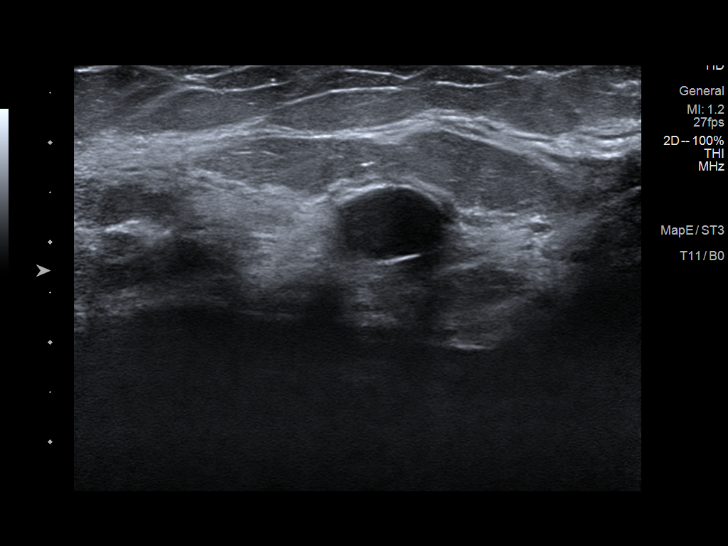
[im 9/25]
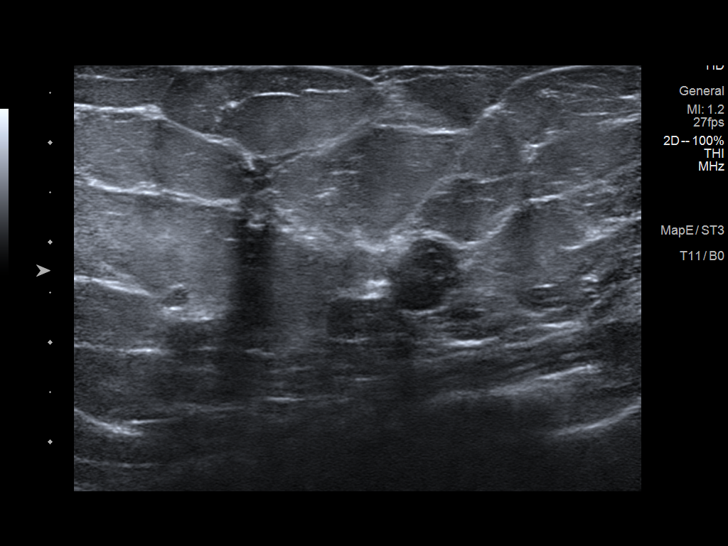
[im 11/25]
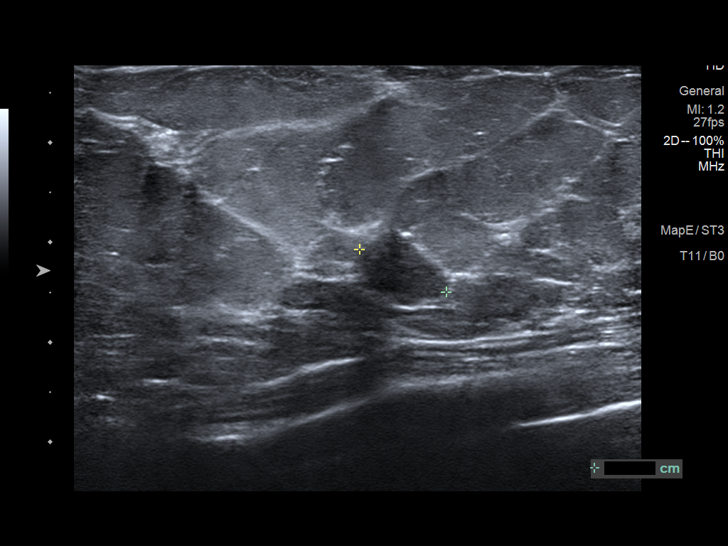
[im 13/25]
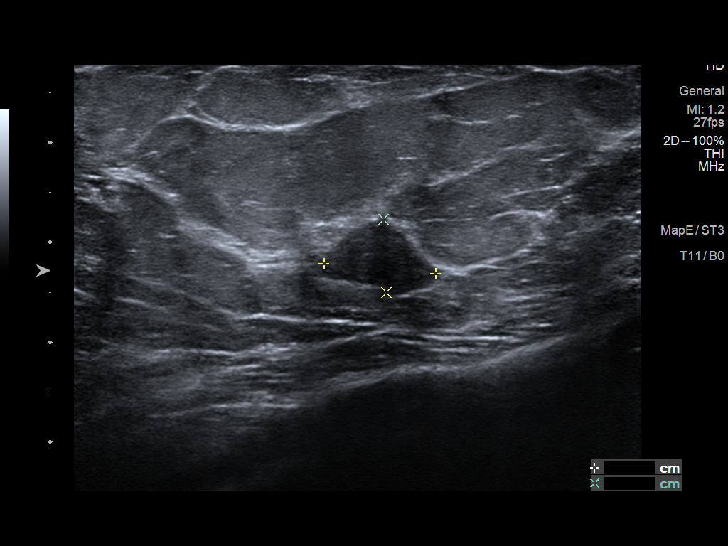
[im 15/25]
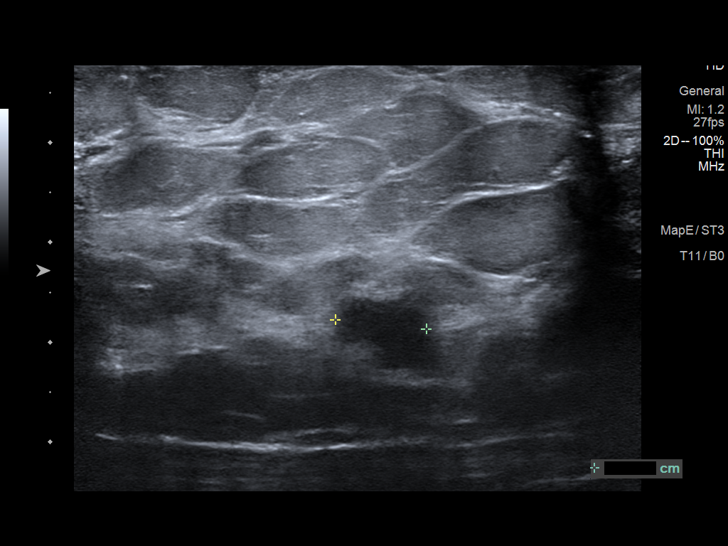
[im 17/25]
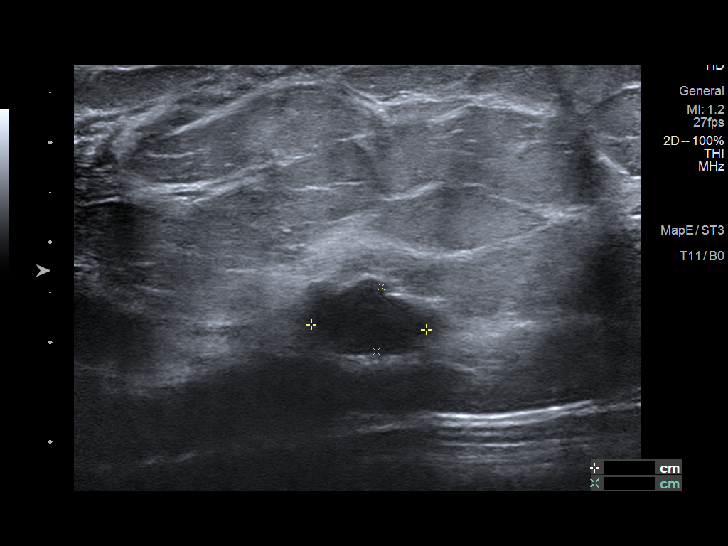
[im 19/25]
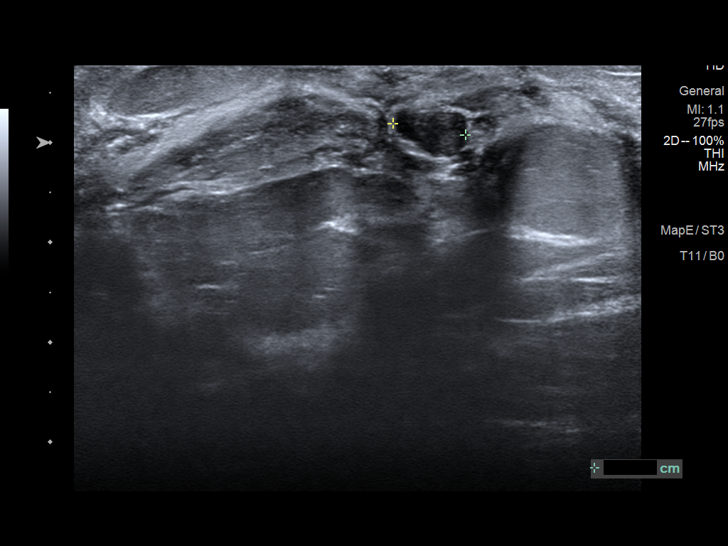
[im 21/25]
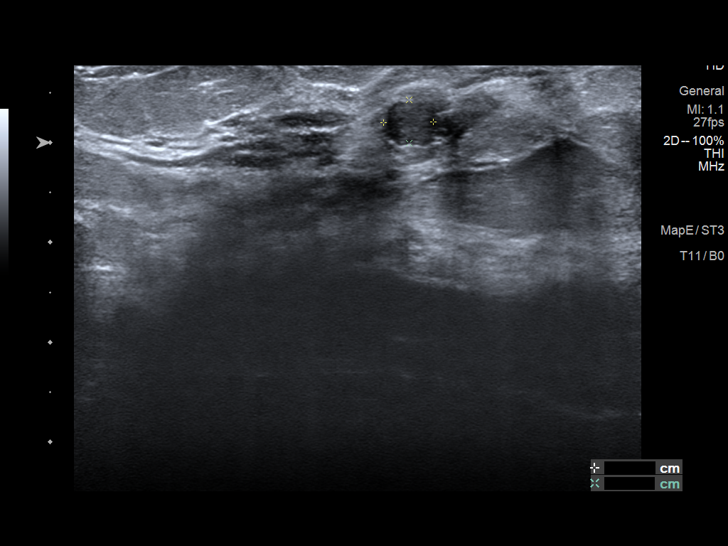
[im 23/25]
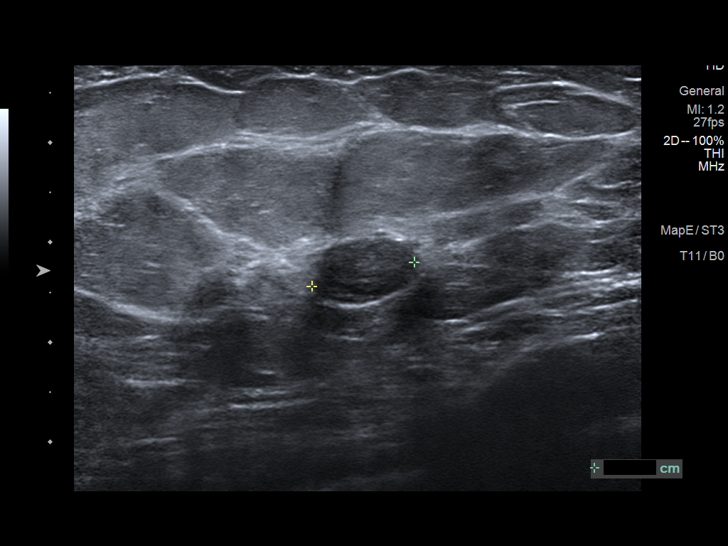
[im 25/25]
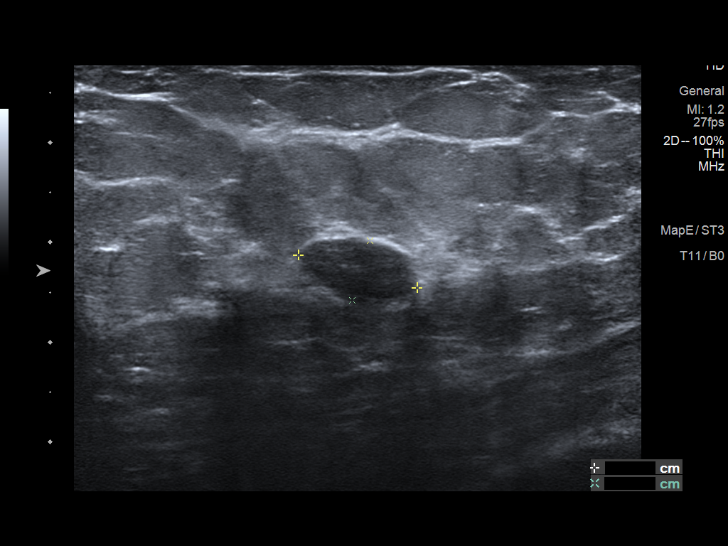

[13 of 25 positions shown; findings below may reference images not displayed]

FINDINGS: Targeted right breast ultrasound is performed, showing multiple
hypoechoic circumscribed masses. At 8 o'clock, 3 cm the nipple,
there is the largest mass measuring 2.7 x 1.6 x 2.6 cm, previously
2.7 x 1.6 x 2.6 cm. At 9 o'clock, 3 cm the nipple there is a mass
measuring 1.1 x 0.7 x 1.0 cm, previously 1.1 x 0.8 x 1.0 cm. At 6
o'clock, 5 cm the nipple, there is a similar mass measuring 1.1 x
0.7 x 1.0 cm, previously 1.0 x 0.9 x 1.0 cm. More posteriorly in the
retroareolar region there is a hypoechoic circumscribed mass
measuring 1.2 x 0.7 x 0.9 cm, previously 1.2 x 0.4 x 1.1 cm in the
more superficial retroareolar right breast there is a small
hypoechoic circumscribed mass measuring 0.7 x 0.4 x 0.5 cm,
previously 0.8 x 0.5 x 0.4 cm. At 2 o'clock, 3 cm the nipple, there
is a hypoechoic circumscribed mass measuring 1.2 x 0.6 x 1.1 cm,
previously 1.3 x 0.6 x 1.1 cm. There are no new masses.

Targeted ultrasound is performed, showing a hypoechoic circumscribed
parallel mass at 11:30 o'clock, 5 cm the nipple, measuring 1.8 x
x 1.3 cm, unchanged from the prior study.
IMPRESSION: 1. Multiple bilateral, benign-appearing and similar appearing
circumscribed breast masses consistent with multiple fibroadenomas,
supported by the diagnosis of left breast fibroadenomas in 4444.
These are all considered benign with no additional short-term
follow-up recommended. No change from the previous ultrasounds.

RECOMMENDATION:
Screening mammogram at age 39 unless there are persistent or
intervening clinical concerns. (Code:3I-O-JCF)

I have discussed the findings and recommendations with the patient.
If applicable, a reminder letter will be sent to the patient
regarding the next appointment.

BI-RADS CATEGORY  2: Benign.

## 2022-12-30 ENCOUNTER — Encounter (HOSPITAL_BASED_OUTPATIENT_CLINIC_OR_DEPARTMENT_OTHER): Payer: Self-pay | Admitting: Obstetrics and Gynecology

## 2022-12-30 ENCOUNTER — Other Ambulatory Visit: Payer: Self-pay

## 2022-12-30 NOTE — Progress Notes (Signed)
Spoke w/ via phone for pre-op interview---pt Lab needs dos---- urine preg t T & S cbc  lab appt 01-01-2023 830 am      Lab results------ COVID test -----patient states asymptomatic no test needed Arrive at -------845 01-15-2023 NPO after MN NO Solid Food.  Clear liquids from MN until---745 Med rec completed Medications to take morning of surgery -----none Diabetic medication -----n/a Patient instructed no nail polish to be worn day of surgery Patient instructed to bring photo id and insurance card day of surgery Patient aware to have Driver (ride ) / caregiver    for 24 hours after surgery  sheila Hunke mother-  Patient Special Instructions ----- Pre-Op special Instructions ----- Patient verbalized understanding of instructions that were given at this phone interview. Patient denies chest pain, sob, fever, cough at the interview.

## 2022-12-30 NOTE — Progress Notes (Addendum)
Your procedure is scheduled on 01-05-2023  Report to [facility] AT  845 AM.   Call this number if you have problems the morning of surgery  :[phone].   OUR ADDRESS IS [address].  WE ARE LOCATED IN [facility].  PLEASE BRING YOUR INSURANCE CARD AND PHOTO ID DAY OF SURGERY.  ONLY 2 PEOPLE ARE ALLOWED IN  WAITING  ROOM                                      REMEMBER:  DO NOT EAT FOOD, CANDY GUM OR MINTS  AFTER MIDNIGHT THE NIGHT BEFORE YOUR SURGERY . YOU MAY HAVE CLEAR LIQUIDS FROM MIDNIGHT THE NIGHT BEFORE YOUR SURGERY UNTIL  745 am NO CLEAR LIQUIDS AFTER 745 am DAY OF SURGERY.  YOU MAY  BRUSH YOUR TEETH MORNING OF SURGERY AND RINSE YOUR MOUTH OUT, NO CHEWING GUM CANDY OR MINTS.     CLEAR LIQUID DIET    Allowed      Water                                                                   Coffee and tea, regular and decaf  (NO cream or milk products of any type, may sweeten)                         Carbonated beverages, regular and diet                                    Sports drinks like Gatorade _____________________________________________________________________     TAKE ONLY THESE MEDICATIONS MORNING OF SURGERY: none                                        DO NOT WEAR JEWERLY/  METAL/  PIERCINGS (INCLUDING NO PLASTIC PIERCINGS) DO NOT WEAR LOTIONS, POWDERS, PERFUMES OR NAIL POLISH ON YOUR FINGERNAILS. TOENAIL POLISH IS OK TO WEAR. DO NOT SHAVE FOR 48 HOURS PRIOR TO DAY OF SURGERY.  CONTACTS, GLASSES, OR DENTURES MAY NOT BE WORN TO SURGERY.  REMEMBER: NO SMOKING, VAPING ,  DRUGS OR ALCOHOL FOR 24 HOURS BEFORE YOUR SURGERY.                                    [facility] IS NOT RESPONSIBLE  FOR ANY BELONGINGS.                                                                    Marland Kitchen           [facility] - Preparing for Surgery Before surgery, you can play  an important role.  Because skin is not sterile, your skin needs to  be as free of germs as possible.  You can reduce the number of germs on your skin by washing with CHG (chlorahexidine gluconate) soap before surgery.  CHG is an antiseptic cleaner which kills germs and bonds with the skin to continue killing germs even after washing. Please DO NOT use if you have an allergy to CHG or antibacterial soaps.  If your skin becomes reddened/irritated stop using the CHG and inform your nurse when you arrive at Short Stay. Do not shave (including legs and underarms) for at least 48 hours prior to the first CHG shower.  You may shave your face/neck. Please follow these instructions carefully:  1.  Shower with CHG Soap the night before surgery and the  morning of Surgery.  2.  If you choose to wash your hair, wash your hair first as usual with your  normal  shampoo.  3.  After you shampoo, rinse your hair and body thoroughly to remove the  shampoo.                                        4.  Use CHG as you would any other liquid soap.  You can apply chg directly  to the skin and wash , chg soap provided, night before and morning of your surgery.  5.  Apply the CHG Soap to your body ONLY FROM THE NECK DOWN.   Do not use on face/ open                           Wound or open sores. Avoid contact with eyes, ears mouth and genitals (private parts).                       Wash face,  Genitals (private parts) with your normal soap.             6.  Wash thoroughly, paying special attention to the area where your surgery  will be performed.  7.  Thoroughly rinse your body with warm water from the neck down.  8.  DO NOT shower/wash with your normal soap after using and rinsing off  the CHG Soap.             9.  Pat yourself dry with a clean towel.            10.  Wear clean pajamas.            11.  Place clean sheets on your bed the night of your first shower and do not  sleep with pets. Day of Surgery : Do not apply any lotions/ powders the morning of surgery.  Please wear clean  clothes to the hospital/surgery center.  IF YOU HAVE ANY SKIN IRRITATION OR PROBLEMS WITH THE SURGICAL SOAP, PLEASE GET A BAR OF GOLD DIAL SOAP AND SHOWER THE NIGHT BEFORE YOUR SURGERY AND THE MORNING OF YOUR SURGERY. PLEASE LET THE NURSE KNOW MORNING OF YOUR SURGERY IF YOU HAD ANY PROBLEMS WITH THE SURGICAL SOAP.   YOUR SURGEON MAY HAVE REQUESTED EXTENDED RECOVERY TIME AFTER YOUR SURGERY. IT COULD BE A  JUST A FEW HOURS  UP TO AN OVERNIGHT STAY.  YOUR SURGEON SHOULD HAVE DISCUSSED THIS WITH YOU PRIOR TO YOUR SURGERY. IN THE EVENT  YOU NEED TO STAY OVERNIGHT PLEASE REFER TO THE FOLLOWING GUIDELINES. YOU MAY HAVE UP TO 4 VISITORS  MAY VISIT IN THE EXTENDED RECOVERY ROOM UNTIL 800 PM ONLY.  ONE  VISITOR AGE 37 AND OVER MAY SPEND THE NIGHT AND MUST BE IN EXTENDED RECOVERY ROOM NO LATER THAN 800 PM . YOUR DISCHARGE TIME AFTER YOU SPEND THE NIGHT IS 900 AM THE MORNING AFTER YOUR SURGERY. YOU MAY PACK A SMALL OVERNIGHT BAG WITH TOILETRIES FOR YOUR OVERNIGHT STAY IF YOU WISH.  REGARDLESS OF IF YOU STAY OVER NIGHT OR ARE DISCHARGED THE SAME DAY YOU WILL BE REQUIRED TO HAVE A RESPONSIBLE ADULT (18 YRS OLD OR OLDER) STAY WITH YOU FOR AT LEAST THE FIRST 24 HOURS  YOUR PRESCRIPTION MEDICATIONS WILL BE PROVIDED DURING YOUR HOSPITAL STAY.  ________________________________________________________________________                                                        QUESTIONS Annye Asa PRE OP NURSE PHONE [phone].

## 2023-01-01 ENCOUNTER — Encounter (HOSPITAL_COMMUNITY)
Admission: RE | Admit: 2023-01-01 | Discharge: 2023-01-01 | Disposition: A | Discharge status: Home / Self Care | Payer: 59 | Source: Ambulatory Visit | Attending: Obstetrics and Gynecology | Admitting: Obstetrics and Gynecology

## 2023-01-01 DIAGNOSIS — Z01812 Encounter for preprocedural laboratory examination: Secondary | ICD-10-CM | POA: Diagnosis present

## 2023-01-01 DIAGNOSIS — Z01818 Encounter for other preprocedural examination: Secondary | ICD-10-CM

## 2023-01-01 LAB — CBC
HCT: 35.4 % — ABNORMAL LOW (ref 36.0–46.0)
Hemoglobin: 11.7 g/dL — ABNORMAL LOW (ref 12.0–15.0)
MCH: 29.8 pg (ref 26.0–34.0)
MCHC: 33.1 g/dL (ref 30.0–36.0)
MCV: 90.3 fL (ref 80.0–100.0)
Platelets: 278 10*3/uL (ref 150–400)
RBC: 3.92 MIL/uL (ref 3.87–5.11)
RDW: 13.7 % (ref 11.5–15.5)
WBC: 6.7 10*3/uL (ref 4.0–10.5)
nRBC: 0 % (ref 0.0–0.2)

## 2023-01-02 NOTE — Progress Notes (Signed)
Patient returned call regarding arrival time, patient aware to arrive at [facility] Monday 01/05/2023. Patient may have clear liquids until 0700.

## 2023-01-03 NOTE — H&P (Signed)
Terri Wright is an 32 y.o. female G1P1 with AUB, daily spotting, for definitive management.  AUB continues despite hyteroscopy/D&C and hormonal contraception.  Pt has no further concerns for fertility, desires definitive management.  D/W pt r/b/a, process and expectations of DaVinci Robot Assisted Total Laparoscopic Hysterectomy/Bilateral Salpingectomy, possible cystoscopy.    Pertinent Gynecological History: No abn pap, last 3/24 HR HPV neg No STI  G1P1 SVD 6#14, female Menstrual History:  No LMP recorded. (Menstrual status: Oral contraceptives).    Past Medical History:  Diagnosis Date   Cancer (HCC) 8/23   Skin cancer   PONV (postoperative nausea and vomiting)    Redundant colon    Skin abnormality    right buttock precancer area removed9-23-2024 area healing    Past Surgical History:  Procedure Laterality Date   ABDOMINAL EXPLORATION SURGERY  2008   with diagnosis of redundant colon   APPENDECTOMY  2008   BREAST EXCISIONAL BIOPSY Left 2010   BREAST EXCISIONAL BIOPSY Right 2010   BREAST SURGERY  2011   lumps removed   skin cancer removal     12/2021 for breast and 2024 right leg 08/2022    Family History  Problem Relation Age of Onset   Anxiety disorder Mother    Hypertension Mother    Cancer Mother    Depression Mother    Breast cancer Mother 44   Cancer Father    Depression Father    Hypertension Father    Anxiety disorder Brother    Early death Brother        from motor vehicle accident   Cancer Maternal Aunt    COPD Maternal Grandmother    Hypertension Maternal Grandmother    Stroke Maternal Grandfather    Cancer Paternal Grandmother    Miscarriages / Stillbirths Paternal Grandmother    Cancer Paternal Grandfather    Other Neg Hx     Social History:  reports that she has never smoked. She has never used smokeless tobacco. She reports current alcohol use of about 3.0 standard drinks of alcohol per week. She reports that she does not use drugs. Single,  benefits and surgery scheduling at [facility]  Allergies: No Known Allergies  Meds: OCP ([facility])  Review of Systems  Constitutional: Negative.   Respiratory: Negative.    Cardiovascular: Negative.   Gastrointestinal: Negative.   Endocrine: Negative.   Genitourinary:  Positive for menstrual problem.  Musculoskeletal: Negative.   Skin: Negative.   Neurological: Negative.   Psychiatric/Behavioral: Negative.      Height 5\' 5"  (1.651 m), weight 90.7 kg. Physical Exam Constitutional:      Appearance: Normal appearance.  HENT:     Head: Normocephalic and atraumatic.  Cardiovascular:     Rate and Rhythm: Normal rate and regular rhythm.  Pulmonary:     Effort: Pulmonary effort is normal.     Breath sounds: Normal breath sounds.  Abdominal:     General: Bowel sounds are normal.     Palpations: Abdomen is soft.  Genitourinary:    General: Normal vulva.     Rectum: Normal.  Musculoskeletal:        General: Normal range of motion.     Cervical back: Normal range of motion and neck supple.  Skin:    General: Skin is warm and dry.  Neurological:     General: No focal deficit present.     Mental Status: She is alert and oriented to person, place, and time.  Psychiatric:  Mood and Affect: Mood normal.        Behavior: Behavior normal.    [place] nl uterus and ovaries, small fibroids, ?polyp  Assessment/Plan: 32yoG1P1 for RA TLH/BS, poss cysto D/w pt r/b/a, process and expectations of surgery Ancef for prophylaxis Expect d/c DOS  Melisa Donofrio Bovard-Stuckert 01/03/2023, 4:46 PM

## 2023-01-05 ENCOUNTER — Ambulatory Visit (HOSPITAL_BASED_OUTPATIENT_CLINIC_OR_DEPARTMENT_OTHER): Payer: 59 | Admitting: Certified Registered Nurse Anesthetist

## 2023-01-05 ENCOUNTER — Other Ambulatory Visit: Payer: Self-pay

## 2023-01-05 ENCOUNTER — Observation Stay (HOSPITAL_BASED_OUTPATIENT_CLINIC_OR_DEPARTMENT_OTHER)
Admission: RE | Admit: 2023-01-05 | Discharge: 2023-01-05 | Disposition: A | Discharge status: Home / Self Care | Payer: 59 | Attending: Obstetrics and Gynecology | Admitting: Obstetrics and Gynecology

## 2023-01-05 ENCOUNTER — Encounter (HOSPITAL_BASED_OUTPATIENT_CLINIC_OR_DEPARTMENT_OTHER): Payer: Self-pay | Admitting: Obstetrics and Gynecology

## 2023-01-05 ENCOUNTER — Encounter (HOSPITAL_BASED_OUTPATIENT_CLINIC_OR_DEPARTMENT_OTHER)
Admission: RE | Disposition: A | Discharge status: Home / Self Care | Payer: Self-pay | Source: Home / Self Care | Attending: Obstetrics and Gynecology

## 2023-01-05 DIAGNOSIS — N939 Abnormal uterine and vaginal bleeding, unspecified: Secondary | ICD-10-CM

## 2023-01-05 DIAGNOSIS — Z9889 Other specified postprocedural states: Secondary | ICD-10-CM

## 2023-01-05 DIAGNOSIS — Z01818 Encounter for other preprocedural examination: Secondary | ICD-10-CM

## 2023-01-05 DIAGNOSIS — Z85828 Personal history of other malignant neoplasm of skin: Secondary | ICD-10-CM | POA: Insufficient documentation

## 2023-01-05 HISTORY — DX: Other specified postprocedural states: Z98.890

## 2023-01-05 HISTORY — PX: ROBOTIC ASSISTED LAPAROSCOPIC HYSTERECTOMY AND SALPINGECTOMY: SHX6379

## 2023-01-05 HISTORY — DX: Disorder of the skin and subcutaneous tissue, unspecified: L98.9

## 2023-01-05 LAB — BASIC METABOLIC PANEL
Anion gap: 10 (ref 5–15)
BUN: 8 mg/dL (ref 6–20)
CO2: 19 mmol/L — ABNORMAL LOW (ref 22–32)
Calcium: 8.5 mg/dL — ABNORMAL LOW (ref 8.9–10.3)
Chloride: 107 mmol/L (ref 98–111)
Creatinine, Ser: 1.23 mg/dL — ABNORMAL HIGH (ref 0.44–1.00)
GFR, Estimated: 60 mL/min — ABNORMAL LOW (ref >60)
Glucose, Bld: 215 mg/dL — ABNORMAL HIGH (ref 70–99)
Potassium: 3.9 mmol/L (ref 3.5–5.1)
Sodium: 136 mmol/L (ref 135–145)

## 2023-01-05 LAB — CBC
HCT: 37.1 % (ref 36.0–46.0)
HCT: 33.8 % — ABNORMAL LOW (ref 36.0–46.0)
Hemoglobin: 12.2 g/dL (ref 12.0–15.0)
Hemoglobin: 11.5 g/dL — ABNORMAL LOW (ref 12.0–15.0)
MCH: 29.5 pg (ref 26.0–34.0)
MCH: 30.1 pg (ref 26.0–34.0)
MCHC: 32.9 g/dL (ref 30.0–36.0)
MCHC: 34 g/dL (ref 30.0–36.0)
MCV: 89.6 fL (ref 80.0–100.0)
MCV: 88.5 fL (ref 80.0–100.0)
Platelets: 258 10*3/uL (ref 150–400)
Platelets: 242 10*3/uL (ref 150–400)
RBC: 4.14 MIL/uL (ref 3.87–5.11)
RBC: 3.82 MIL/uL — ABNORMAL LOW (ref 3.87–5.11)
RDW: 13.8 % (ref 11.5–15.5)
RDW: 13.8 % (ref 11.5–15.5)
WBC: 6.3 10*3/uL (ref 4.0–10.5)
WBC: 9.7 10*3/uL (ref 4.0–10.5)
nRBC: 0 % (ref 0.0–0.2)
nRBC: 0 % (ref 0.0–0.2)

## 2023-01-05 LAB — COMPREHENSIVE METABOLIC PANEL
ALT: 25 U/L (ref 0–44)
AST: 28 U/L (ref 15–41)
Albumin: 3.9 g/dL (ref 3.5–5.0)
Alkaline Phosphatase: 71 U/L (ref 38–126)
Anion gap: 14 (ref 5–15)
BUN: 10 mg/dL (ref 6–20)
CO2: 20 mmol/L — ABNORMAL LOW (ref 22–32)
Calcium: 8.8 mg/dL — ABNORMAL LOW (ref 8.9–10.3)
Chloride: 106 mmol/L (ref 98–111)
Creatinine, Ser: 0.96 mg/dL (ref 0.44–1.00)
GFR, Estimated: >60 mL/min (ref >60)
Glucose, Bld: 90 mg/dL (ref 70–99)
Potassium: 3.1 mmol/L — ABNORMAL LOW (ref 3.5–5.1)
Sodium: 140 mmol/L (ref 135–145)
Total Bilirubin: 0.9 mg/dL (ref <1.2)
Total Protein: 7.5 g/dL (ref 6.5–8.1)

## 2023-01-05 LAB — TYPE AND SCREEN
ABO/RH(D): A POS
ABO/RH(D): A POS
Antibody Screen: NEGATIVE
Antibody Screen: NEGATIVE

## 2023-01-05 LAB — ABO/RH: ABO/RH(D): A POS

## 2023-01-05 LAB — POCT PREGNANCY, URINE: Preg Test, Ur: NEGATIVE

## 2023-01-05 SURGERY — XI ROBOTIC ASSISTED LAPAROSCOPIC HYSTERECTOMY AND SALPINGECTOMY
Anesthesia: General | Site: Pelvis | Laterality: Bilateral

## 2023-01-05 MED ORDER — MENTHOL 3 MG MT LOZG: 1.0000 | LOZENGE | OROMUCOSAL | Status: DC | PRN

## 2023-01-05 MED ORDER — LACTATED RINGERS IV SOLN: INTRAVENOUS | Status: DC

## 2023-01-05 MED ORDER — LORATADINE 10 MG PO TABS: 10.0000 mg | ORAL_TABLET | Freq: Every day | ORAL | Status: DC

## 2023-01-05 MED ORDER — ACETAMINOPHEN 500 MG PO TABS
ORAL_TABLET | ORAL | Status: AC
  Filled 2023-01-05: qty 2

## 2023-01-05 MED ORDER — ONDANSETRON HCL 4 MG/2ML IJ SOLN: 4.0000 mg | Freq: Once | INTRAMUSCULAR | Status: DC | PRN

## 2023-01-05 MED ORDER — SIMETHICONE 80 MG PO CHEW: 80.0000 mg | CHEWABLE_TABLET | Freq: Four times a day (QID) | ORAL | Status: DC | PRN

## 2023-01-05 MED ORDER — FENTANYL CITRATE (PF) 250 MCG/5ML IJ SOLN
INTRAMUSCULAR | Status: AC
  Filled 2023-01-05: qty 5

## 2023-01-05 MED ORDER — FENTANYL CITRATE (PF) 250 MCG/5ML IJ SOLN
INTRAMUSCULAR | Status: DC | PRN
  Administered 2023-01-05 (×2): 50 ug via INTRAVENOUS
  Administered 2023-01-05: 100 ug via INTRAVENOUS
  Administered 2023-01-05: 50 ug via INTRAVENOUS

## 2023-01-05 MED ORDER — MIDAZOLAM HCL 2 MG/2ML IJ SOLN
INTRAMUSCULAR | Status: DC | PRN
  Administered 2023-01-05: 2 mg via INTRAVENOUS

## 2023-01-05 MED ORDER — PROPOFOL 10 MG/ML IV BOLUS
INTRAVENOUS | Status: AC
  Filled 2023-01-05: qty 20

## 2023-01-05 MED ORDER — ONDANSETRON HCL 4 MG/2ML IJ SOLN: 4.0000 mg | Freq: Four times a day (QID) | INTRAMUSCULAR | Status: DC | PRN

## 2023-01-05 MED ORDER — OXYCODONE-ACETAMINOPHEN 5-325 MG PO TABS
1.0000 | ORAL_TABLET | ORAL | Status: DC | PRN
Start: 2023-01-05 — End: 2023-01-05

## 2023-01-05 MED ORDER — SUGAMMADEX SODIUM 200 MG/2ML IV SOLN
INTRAVENOUS | Status: DC | PRN
  Administered 2023-01-05: 200 mg via INTRAVENOUS

## 2023-01-05 MED ORDER — STERILE WATER FOR IRRIGATION IR SOLN
Status: DC | PRN
  Administered 2023-01-05: 500 mL

## 2023-01-05 MED ORDER — LIDOCAINE HCL (PF) 2 % IJ SOLN
INTRAMUSCULAR | Status: AC
  Filled 2023-01-05: qty 5

## 2023-01-05 MED ORDER — ONDANSETRON HCL 4 MG/2ML IJ SOLN
INTRAMUSCULAR | Status: DC | PRN
  Administered 2023-01-05: 4 mg via INTRAVENOUS

## 2023-01-05 MED ORDER — HYDROMORPHONE HCL 1 MG/ML IJ SOLN: 0.2500 mg | INTRAMUSCULAR | Status: DC | PRN

## 2023-01-05 MED ORDER — BUPIVACAINE HCL (PF) 0.25 % IJ SOLN
INTRAMUSCULAR | Status: DC | PRN
  Administered 2023-01-05: 14 mL

## 2023-01-05 MED ORDER — DEXAMETHASONE SODIUM PHOSPHATE 10 MG/ML IJ SOLN
INTRAMUSCULAR | Status: DC | PRN
  Administered 2023-01-05: 10 mg via INTRAVENOUS

## 2023-01-05 MED ORDER — ROCURONIUM BROMIDE 10 MG/ML (PF) SYRINGE
PREFILLED_SYRINGE | INTRAVENOUS | Status: AC
  Filled 2023-01-05: qty 10

## 2023-01-05 MED ORDER — GABAPENTIN 300 MG PO CAPS
300.0000 mg | ORAL_CAPSULE | ORAL | Status: AC
  Administered 2023-01-05: 300 mg via ORAL

## 2023-01-05 MED ORDER — HYDROMORPHONE HCL 1 MG/ML IJ SOLN: 0.2000 mg | INTRAMUSCULAR | Status: DC | PRN

## 2023-01-05 MED ORDER — ONDANSETRON HCL 4 MG PO TABS: 4.0000 mg | ORAL_TABLET | Freq: Four times a day (QID) | ORAL | Status: DC | PRN

## 2023-01-05 MED ORDER — OXYCODONE HCL 5 MG PO TABS: 5.0000 mg | ORAL_TABLET | Freq: Once | ORAL | Status: DC | PRN

## 2023-01-05 MED ORDER — ROCURONIUM BROMIDE 10 MG/ML (PF) SYRINGE
PREFILLED_SYRINGE | INTRAVENOUS | Status: DC | PRN
  Administered 2023-01-05: 60 mg via INTRAVENOUS

## 2023-01-05 MED ORDER — CEFAZOLIN SODIUM-DEXTROSE 2-4 GM/100ML-% IV SOLN
2.0000 g | INTRAVENOUS | Status: AC
  Administered 2023-01-05: 2 g via INTRAVENOUS

## 2023-01-05 MED ORDER — MIDAZOLAM HCL 2 MG/2ML IJ SOLN
INTRAMUSCULAR | Status: AC
  Filled 2023-01-05: qty 2

## 2023-01-05 MED ORDER — KETOROLAC TROMETHAMINE 30 MG/ML IJ SOLN
INTRAMUSCULAR | Status: DC | PRN
  Administered 2023-01-05: 30 mg via INTRAVENOUS

## 2023-01-05 MED ORDER — GUAIFENESIN 100 MG/5ML PO LIQD
15.0000 mL | ORAL | Status: DC | PRN
  Filled 2023-01-05: qty 15

## 2023-01-05 MED ORDER — ACETAMINOPHEN 500 MG PO TABS
1000.0000 mg | ORAL_TABLET | ORAL | Status: AC
  Administered 2023-01-05: 1000 mg via ORAL

## 2023-01-05 MED ORDER — ONDANSETRON HCL 4 MG/2ML IJ SOLN
INTRAMUSCULAR | Status: AC
  Filled 2023-01-05: qty 2

## 2023-01-05 MED ORDER — GABAPENTIN 300 MG PO CAPS
ORAL_CAPSULE | ORAL | Status: AC
  Filled 2023-01-05: qty 1

## 2023-01-05 MED ORDER — KETOROLAC TROMETHAMINE 30 MG/ML IJ SOLN
INTRAMUSCULAR | Status: AC
  Filled 2023-01-05: qty 1

## 2023-01-05 MED ORDER — IBUPROFEN 800 MG PO TABS: 800.0000 mg | ORAL_TABLET | Freq: Three times a day (TID) | ORAL | Status: DC | PRN

## 2023-01-05 MED ORDER — ALUM & MAG HYDROXIDE-SIMETH 200-200-20 MG/5ML PO SUSP: 30.0000 mL | ORAL | Status: DC | PRN

## 2023-01-05 MED ORDER — KETOROLAC TROMETHAMINE 30 MG/ML IJ SOLN: 30.0000 mg | Freq: Once | INTRAMUSCULAR | Status: DC | PRN

## 2023-01-05 MED ORDER — SCOPOLAMINE 1 MG/3DAYS TD PT72
MEDICATED_PATCH | TRANSDERMAL | Status: AC
  Filled 2023-01-05: qty 1

## 2023-01-05 MED ORDER — LIDOCAINE 2% (20 MG/ML) 5 ML SYRINGE
INTRAMUSCULAR | Status: DC | PRN
  Administered 2023-01-05: 100 mg via INTRAVENOUS

## 2023-01-05 MED ORDER — POVIDONE-IODINE 10 % EX SWAB: 2.0000 | Freq: Once | CUTANEOUS | Status: DC

## 2023-01-05 MED ORDER — ACETAMINOPHEN 500 MG PO TABS: 1000.0000 mg | ORAL_TABLET | Freq: Once | ORAL | Status: DC

## 2023-01-05 MED ORDER — PROPOFOL 10 MG/ML IV BOLUS
INTRAVENOUS | Status: DC | PRN
  Administered 2023-01-05: 200 mg via INTRAVENOUS

## 2023-01-05 MED ORDER — OXYCODONE HCL 5 MG/5ML PO SOLN: 5.0000 mg | Freq: Once | ORAL | Status: DC | PRN

## 2023-01-05 MED ORDER — DROPERIDOL 2.5 MG/ML IJ SOLN
INTRAMUSCULAR | Status: DC | PRN
  Administered 2023-01-05: .625 mg via INTRAVENOUS

## 2023-01-05 MED ORDER — DEXAMETHASONE SODIUM PHOSPHATE 10 MG/ML IJ SOLN
INTRAMUSCULAR | Status: AC
  Filled 2023-01-05: qty 1

## 2023-01-05 MED ORDER — CEFAZOLIN SODIUM-DEXTROSE 2-4 GM/100ML-% IV SOLN
INTRAVENOUS | Status: AC
  Filled 2023-01-05: qty 100

## 2023-01-05 SURGICAL SUPPLY — 68 items
ADH SKN CLS APL DERMABOND .7 (GAUZE/BANDAGES/DRESSINGS) ×2
APL SRG 38 LTWT LNG FL B (MISCELLANEOUS)
APPLICATOR ARISTA FLEXITIP XL (MISCELLANEOUS) IMPLANT
CANNULA CAP OBTURATR AIRSEAL 8 (CAP) ×2 IMPLANT
CANNULA REDUCER 12-8 DVNC XI (CANNULA) IMPLANT
CATH FOLEY 3WAY 5CC 16FR (CATHETERS) ×2 IMPLANT
COVER BACK TABLE 60X90IN (DRAPES) ×2 IMPLANT
COVER TIP SHEARS 8 DVNC (MISCELLANEOUS) ×2 IMPLANT
DEFOGGER SCOPE WARMER CLEARIFY (MISCELLANEOUS) ×2 IMPLANT
DERMABOND ADVANCED .7 DNX12 (GAUZE/BANDAGES/DRESSINGS) ×2 IMPLANT
DRAPE ARM DVNC X/XI (DISPOSABLE) ×8 IMPLANT
DRAPE COLUMN DVNC XI (DISPOSABLE) ×2 IMPLANT
DRAPE SURG IRRIG POUCH 19X23 (DRAPES) ×2 IMPLANT
DRAPE UTILITY XL STRL (DRAPES) ×2 IMPLANT
DRIVER NDL MEGA SUTCUT DVNCXI (INSTRUMENTS) ×1 IMPLANT
DRIVER NDLE MEGA SUTCUT DVNCXI (INSTRUMENTS) ×2
DURAPREP 26ML APPLICATOR (WOUND CARE) ×2 IMPLANT
ELECT REM PT RETURN 9FT ADLT (ELECTROSURGICAL) ×2
ELECTRODE REM PT RTRN 9FT ADLT (ELECTROSURGICAL) ×1 IMPLANT
FORCEPS BPLR FENES DVNC XI (FORCEP) ×2 IMPLANT
FORCEPS PROGRASP DVNC XI (FORCEP) IMPLANT
GAUZE 4X4 16PLY ~~LOC~~+RFID DBL (SPONGE) ×2 IMPLANT
GLOVE BIO SURGEON STRL SZ 6.5 (GLOVE) ×6 IMPLANT
GLOVE BIOGEL PI IND STRL 7.0 (GLOVE) ×4 IMPLANT
GLOVE ECLIPSE 7.0 STRL STRAW (GLOVE) ×1 IMPLANT
HEMOSTAT ARISTA ABSORB 3G PWDR (HEMOSTASIS) IMPLANT
HOLDER FOLEY CATH W/STRAP (MISCELLANEOUS) ×1 IMPLANT
IRRIG SUCT STRYKERFLOW 2 WTIP (MISCELLANEOUS) ×2
IRRIGATION SUCT STRKRFLW 2 WTP (MISCELLANEOUS) ×1 IMPLANT
KIT PINK PAD W/HEAD ARE REST (MISCELLANEOUS) ×2
KIT PINK PAD W/HEAD ARM REST (MISCELLANEOUS) ×1 IMPLANT
KIT TURNOVER CYSTO (KITS) ×2 IMPLANT
LEGGING LITHOTOMY PAIR STRL (DRAPES) ×2 IMPLANT
MANIFOLD NEPTUNE II (INSTRUMENTS) ×2 IMPLANT
MANIPULATOR ADVINCU DEL 2.5 PL (MISCELLANEOUS) IMPLANT
MANIPULATOR ADVINCU DEL 3.0 PL (MISCELLANEOUS) ×1 IMPLANT
MANIPULATOR ADVINCU DEL 3.5 PL (MISCELLANEOUS) IMPLANT
MANIPULATOR ADVINCU DEL 4.0 PL (MISCELLANEOUS) IMPLANT
NDL INSUFFLATION 14GA 120MM (NEEDLE) ×1 IMPLANT
NEEDLE INSUFFLATION 14GA 120MM (NEEDLE) ×2
OBTURATOR OPTICAL STND 8 DVNC (TROCAR) ×2
OBTURATOR OPTICALSTD 8 DVNC (TROCAR) ×1 IMPLANT
OCCLUDER COLPOPNEUMO (BALLOONS) ×2 IMPLANT
PACK ROBOT WH (CUSTOM PROCEDURE TRAY) ×2 IMPLANT
PACK ROBOTIC GOWN (GOWN DISPOSABLE) ×2 IMPLANT
PAD OB MATERNITY 4.3X12.25 (PERSONAL CARE ITEMS) ×2 IMPLANT
PAD PREP 24X48 CUFFED NSTRL (MISCELLANEOUS) ×2 IMPLANT
PROTECTOR NERVE ULNAR (MISCELLANEOUS) ×1 IMPLANT
RETRACTOR WND ALEXIS 18 MED (MISCELLANEOUS) IMPLANT
RTRCTR WOUND ALEXIS 18CM MED (MISCELLANEOUS)
RTRCTR WOUND ALEXIS 18CM SML (INSTRUMENTS)
SAVER CELL AAL HAEMONETICS (INSTRUMENTS) IMPLANT
SCISSORS MNPLR CVD DVNC XI (INSTRUMENTS) ×2 IMPLANT
SEAL UNIV 5-12 XI (MISCELLANEOUS) ×4 IMPLANT
SEALER VESSEL EXT DVNC XI (MISCELLANEOUS) ×1 IMPLANT
SET IRRIG Y TYPE TUR BLADDER L (SET/KITS/TRAYS/PACK) IMPLANT
SET TUBE FILTERED XL AIRSEAL (SET/KITS/TRAYS/PACK) ×2 IMPLANT
SLEEVE SCD COMPRESS KNEE MED (STOCKING) ×2 IMPLANT
SPIKE FLUID TRANSFER (MISCELLANEOUS) ×3 IMPLANT
SUT VIC AB 0 CT1 27 (SUTURE)
SUT VIC AB 0 CT1 27XBRD ANBCTR (SUTURE) IMPLANT
SUT VIC AB 4-0 PS2 18 (SUTURE) ×4 IMPLANT
SUT VICRYL 0 UR6 27IN ABS (SUTURE) IMPLANT
SUT VLOC 180 0 9IN GS21 (SUTURE) ×2 IMPLANT
TOWEL OR 17X24 6PK STRL BLUE (TOWEL DISPOSABLE) ×2 IMPLANT
TROCAR PORT AIRSEAL 8X100 (TROCAR) IMPLANT
WATER STERILE IRR 1000ML POUR (IV SOLUTION) ×1 IMPLANT
WATER STERILE IRR 500ML POUR (IV SOLUTION) ×1 IMPLANT

## 2023-01-05 NOTE — Progress Notes (Signed)
Day of Surgery Procedure(s) (LRB): XI ROBOTIC ASSISTED LAPAROSCOPIC HYSTERECTOMY AND SALPINGECTOMY (Bilateral)  Subjective: Patient reports incisional pain, tolerating PO, and no problems voiding.  Pain controlled  Objective: I have reviewed patient's vital signs, intake and output, medications, and labs.  General: alert and no distress Resp: clear to auscultation bilaterally Cardio: regular rate and rhythm GI: soft, non-tender; bowel sounds normal; no masses,  no organomegaly Extremities: extremities normal, atraumatic, no cyanosis or edema Inc: C/D/I  Assessment: s/p Procedure(s): XI ROBOTIC ASSISTED LAPAROSCOPIC HYSTERECTOMY AND SALPINGECTOMY (Bilateral): stable and progressing well  Plan: Encourage ambulation Advance to PO medication Discharge home when ambulating, voiding, tolerating po and pain controlled Pain meds given prior to surgery  LOS: 0 days    Sherian Rein, MD 01/05/2023, 2:52 PM

## 2023-01-05 NOTE — Discharge Summary (Signed)
Physician Discharge Summary  Patient ID: Terri Wright MRN: [id number] DOB/AGE: 01-Feb-1991 32 y.o.  Admit date: 01/05/2023 Discharge date: 01/05/2023  Admission Diagnoses: AUB  Discharge Diagnoses:  Principal Problem:   S/P robot-assisted surgical procedure   Discharged Condition: good  Hospital Course: admitted for hysterectomy - RA TLH/BS for AUB underwent without problems - D/C to home when ambulating, voiding, tolerating po and pain controlled.  Labs stable  Consults: None  Significant Diagnostic Studies: labs: CBC, BMP  Treatments: surgery: RA TLH/BS  Discharge Exam: Blood pressure 121/86, pulse 84, temperature (!) 97.5 F (36.4 C), resp. rate 16, height 5\' 5"  (1.651 m), weight 93.8 kg, SpO2 97%. General appearance: alert and no distress Resp: clear to auscultation bilaterally Cardio: regular rate and rhythm GI: soft, non-tender; bowel sounds normal; no masses,  no organomegaly Extremities: extremities normal, atraumatic, no cyanosis or edema Incision/Wound: C/D/I  Disposition: Discharge disposition: 01-Home or Self Care       Discharge Instructions     Call MD for:  persistant nausea and vomiting   Complete by: As directed    Call MD for:  redness, tenderness, or signs of infection (pain, swelling, redness, odor or green/yellow discharge around incision site)   Complete by: As directed    Call MD for:  severe uncontrolled pain   Complete by: As directed    Diet - low sodium heart healthy   Complete by: As directed    Discharge instructions   Complete by: As directed    Call [phone] with questions or problems (or Augusto Gamble [phone])   Driving Restrictions   Complete by: As directed    While taking strong pain medicine   Increase activity slowly   Complete by: As directed    Lifting restrictions   Complete by: As directed    No greater than 10-15lbs for 6 weeks   May shower / Bathe   Complete by: As directed    May walk up steps   Complete by: As  directed    Sexual Activity Restrictions   Complete by: As directed    Pelvic rest - no douching, tampons or sex for 6 weeks      Allergies as of 01/05/2023   No Known Allergies      Medication List     STOP taking these medications    Aviane 0.1-20 MG-MCG tablet Generic drug: levonorgestrel-ethinyl estradiol       TAKE these medications    All Day Allergy 10 MG tablet Generic drug: cetirizine Take 10 mg by mouth at bedtime.   cholecalciferol 25 MCG (1000 UNIT) tablet Commonly known as: VITAMIN D3 Take 1,000 Units by mouth daily.        Follow-up Information     Bovard-Stuckert, Constanza Mincy, MD. Call in 2 week(s).   Specialty: Obstetrics and Gynecology Why: For postop check also 6 weeks Contact information: [address] [place] [address] [phone]                 Signed: Sherian Rein 01/05/2023, 3:00 PM

## 2023-01-05 NOTE — Interval H&P Note (Signed)
History and Physical Interval Note:  01/05/2023 9:10 AM  Terri Wright  has presented today for surgery, with the diagnosis of abnormal bleeding.  The various methods of treatment have been discussed with the patient and family. After consideration of risks, benefits and other options for treatment, the patient has consented to  Procedure(s): XI ROBOTIC ASSISTED LAPAROSCOPIC HYSTERECTOMY AND SALPINGECTOMY (Bilateral) CYSTOSCOPY (N/A) as a surgical intervention.  The patient's history has been reviewed, patient examined, no change in status, stable for surgery.  I have reviewed the patient's chart and labs.  Questions were answered to the patient's satisfaction.     Walther Sanagustin Bovard-Stuckert

## 2023-01-05 NOTE — Anesthesia Procedure Notes (Signed)
Procedure Name: Intubation Date/Time: 01/05/2023 9:56 AM  Performed by: Pearson Grippe, CRNAPre-anesthesia Checklist: Patient identified, Emergency Drugs available, Suction available and Patient being monitored Patient Re-evaluated:Patient Re-evaluated prior to induction Oxygen Delivery Method: Circle system utilized Preoxygenation: Pre-oxygenation with 100% oxygen Induction Type: IV induction Ventilation: Mask ventilation without difficulty Laryngoscope Size: Miller and 2 Grade View: Grade I Tube type: Oral Tube size: 7.0 mm Number of attempts: 1 Airway Equipment and Method: Stylet Placement Confirmation: ETT inserted through vocal cords under direct vision, positive ETCO2 and breath sounds checked- equal and bilateral Secured at: 21 cm Tube secured with: Tape Dental Injury: Teeth and Oropharynx as per pre-operative assessment

## 2023-01-05 NOTE — Transfer of Care (Signed)
Immediate Anesthesia Transfer of Care Note  Patient: Terri Wright  Procedure(s) Performed: XI ROBOTIC ASSISTED LAPAROSCOPIC HYSTERECTOMY AND SALPINGECTOMY (Bilateral: Pelvis) CYSTOSCOPY (Bladder)  Patient Location: PACU  Anesthesia Type:General  Level of Consciousness: awake, alert , and oriented  Airway & Oxygen Therapy: Patient Spontanous Breathing and Patient connected to face mask oxygen  Post-op Assessment: Report given to RN, Post -op Vital signs reviewed and stable, and Patient moving all extremities  Post vital signs: Reviewed and stable  Last Vitals:  Vitals Value Taken Time  BP 106/72 01/05/23 1140  Temp 36.8 C 01/05/23 1140  Pulse 72 01/05/23 1144  Resp 19 01/05/23 1144  SpO2 100 % 01/05/23 1144  Vitals shown include unfiled device data.  Last Pain:  Vitals:   01/05/23 1140  TempSrc:   PainSc: 0-No pain      Patients Stated Pain Goal: 8 (01/05/23 0848)  Complications: No notable events documented.

## 2023-01-05 NOTE — Op Note (Signed)
Terri Wright, Terri Wright MEDICAL RECORD NO: [id number] ACCOUNT NO: [id number] DATE OF BIRTH: 09-02-1990 FACILITY: [facility] LOCATION: [facility] PHYSICIAN: Sherian Rein, MD  Operative Report   DATE OF PROCEDURE: 01/05/2023   PREOPERATIVE DIAGNOSIS:  Abnormal uterine bleeding.  POSTOPERATIVE DIAGNOSIS:  Abnormal uterine bleeding.  PROCEDURE:  DaVinci robotic-assisted total laparoscopic hysterectomy and bilateral salpingectomy.  SURGEON:  Sherian Rein, MD  ASSISTANT:  Webb Silversmith, RNFA.    ANESTHESIA:  Local and general.  ESTIMATED BLOOD LOSS:  50 mL.  IV FLUIDS AND URINE OUTPUT:  Per anesthesia.  COMPLICATIONS:  None.  PATHOLOGY:  Uterus, cervix, and bilateral fallopian tubes.  DESCRIPTION OF PROCEDURE:  After informed consent was reviewed with the patient and her mother, she was transported to the operating room and placed on the table in the supine position.  General anesthesia was induced and found to be adequate.  She was  then prepped and draped in the normal sterile fashion.  After an appropriate timeout was performed, an Advincula retractor was placed.  A uterine manipulator was placed into her uterus.  A Foley catheter was placed.  The gloves and gowns were changed.   Attention was turned to the abdominal portion of the case.  An approximately 8 mm supraumbilical incision was made.  The fascia was cleared off with a hemostat and using a Veress needle.  Pneumoperitoneum was obtained after passing the hanging drop test  with an opening pressure of 2 mmHg.  The trocar was placed under direct visualization.  Accessory ports were placed on both the right and left under direct visualization as well.  Brief pelvic survey performed revealed normal uterus, tubes, and ovaries.   After the robot was docked, the surgeon was seated at the console.  The left fimbriated end of the tube was grasped and elevated.  The tube was excised to the level of the cornua.   Utero-ovarian ligaments and round ligaments were ligated.  Cardinal  ligaments were ligated to the level of the uterine artery.  The uterine artery was ligated.  The bladder flap was created to the midline.  Attention was turned to the right side, which in a similar fashion the right fimbriated end was grasped and the  tube was excised to the level of the cornua.  Uteroovarian  ligament was ligated.  The broad ligament was ligated.  The cardinal ligaments were ligated to the level of the uterine artery.  The uterine artery was ligated.  The bladder flap was created to  meet with the bladder flap from the other side.  The advincula cup was palpated and colpotomy was performed.  The uterus was circumscribed and the uterus was delivered through the vaginal opening.  Hemostasis was assured.  The vaginal cuff was closed with 0  Vicryl V-Loc suture.  Hemostasis was assured.  The ureters were confidently identified multiple times throughout the procedure.  Following the procedure, the skin incisions were closed with 4-0 Vicryl and Dermabond sutures.  Inspection of the vaginal  cuff showed that it was intact vaginally.  The clot was cleared from the vagina.  The patient tolerated the procedure well.  Sponge, lap and needle counts were correct x2. Patient was awakened in stable condition and transported to PACU.      [email or web address] D: 01/05/2023 11:46:07 am T: 01/05/2023 9:32:00 pm  JOB: [id number]/ [id number]

## 2023-01-05 NOTE — Anesthesia Preprocedure Evaluation (Signed)
Anesthesia Evaluation    History of Anesthesia Complications (+) PONV and history of anesthetic complications  Airway Mallampati: II  TM Distance: >3 FB Neck ROM: Full    Dental no notable dental hx.    Pulmonary    Pulmonary exam normal breath sounds clear to auscultation       Cardiovascular Normal cardiovascular exam Rhythm:Regular Rate:Normal     Neuro/Psych    GI/Hepatic ,,,(+)     (-) substance abuse    Endo/Other    Renal/GU      Musculoskeletal   Abdominal   Peds  Hematology  (+) Blood dyscrasia, anemia   Anesthesia Other Findings   Reproductive/Obstetrics                             Anesthesia Physical Anesthesia Plan  ASA: 2  Anesthesia Plan: General   Post-op Pain Management: Tylenol PO (pre-op)*   Induction: Intravenous  PONV Risk Score and Plan: 4 or greater and Ondansetron, Droperidol, Dexamethasone, Midazolam, Scopolamine patch - Pre-op and Treatment may vary due to age or medical condition  Airway Management Planned: Oral ETT  Additional Equipment:   Intra-op Plan:   Post-operative Plan: Extubation in OR  Informed Consent: I have reviewed the patients History and Physical, chart, labs and discussed the procedure including the risks, benefits and alternatives for the proposed anesthesia with the patient or authorized representative who has indicated his/her understanding and acceptance.     Dental advisory given  Plan Discussed with: CRNA and Surgeon  Anesthesia Plan Comments:        Anesthesia Quick Evaluation

## 2023-01-05 NOTE — Brief Op Note (Signed)
01/05/2023  11:36 AM  PATIENT:  Mardene Celeste  32 y.o. female  PRE-OPERATIVE DIAGNOSIS:  abnormal bleeding  POST-OPERATIVE DIAGNOSIS:  abnormal bleeding  PROCEDURE:  Procedure(s): XI ROBOTIC ASSISTED LAPAROSCOPIC HYSTERECTOMY AND SALPINGECTOMY (Bilateral) CYSTOSCOPY (N/A)  SURGEON:  Surgeons and Role:    * Bovard-Stuckert, Augusto Gamble, MD - Primary  ASSISTANTS: Webb Silversmith RNFA   ANESTHESIA:   local and general  EBL:  50cc IVF and UOP per anesthesia  DRAINS: Urinary Catheter (Foley)   LOCAL MEDICATIONS USED:  MARCAINE     SPECIMEN:  Source of Specimen:  uterus, cervix, and  B fallopian tubes  DISPOSITION OF SPECIMEN:  PATHOLOGY  COUNTS:  YES  TOURNIQUET:  * No tourniquets in log *  DICTATION: .Other Dictation: Dictation Number [id number]  PLAN OF CARE:  extended recovery  PATIENT DISPOSITION:  PACU - hemodynamically stable.   Delay start of Pharmacological VTE agent (>24hrs) due to surgical blood loss or risk of bleeding: not applicable

## 2023-01-05 NOTE — Anesthesia Postprocedure Evaluation (Signed)
Anesthesia Post Note  Patient: Terri Wright  Procedure(s) Performed: XI ROBOTIC ASSISTED LAPAROSCOPIC HYSTERECTOMY AND SALPINGECTOMY (Bilateral: Pelvis) CYSTOSCOPY (Bladder)     Patient location during evaluation: PACU Anesthesia Type: General Level of consciousness: awake and alert Pain management: pain level controlled Vital Signs Assessment: post-procedure vital signs reviewed and stable Respiratory status: spontaneous breathing, nonlabored ventilation, respiratory function stable and patient connected to nasal cannula oxygen Cardiovascular status: blood pressure returned to baseline and stable Postop Assessment: no apparent nausea or vomiting Anesthetic complications: no  No notable events documented.  Last Vitals:  Vitals:   01/05/23 1145 01/05/23 1215  BP: 115/77 114/74  Pulse: 72 77  Resp: 19 14  Temp:    SpO2: 100% 95%    Last Pain:  Vitals:   01/05/23 1215  TempSrc:   PainSc: Asleep                 [name] S

## 2023-01-06 LAB — SURGICAL PATHOLOGY

## 2023-01-07 ENCOUNTER — Encounter (HOSPITAL_BASED_OUTPATIENT_CLINIC_OR_DEPARTMENT_OTHER): Payer: Self-pay | Admitting: Obstetrics and Gynecology

## 2023-01-22 NOTE — Progress Notes (Signed)
Subjective:   Patient ID: Terri Wright, female DOB: 1991-02-11, 32 y.o.    MRN: [id number]  Chief Complaint  Patient presents with   Medical Management of Chronic Issues    3 month follow-up  Not fasting    HPI - Not fasting  Elevated BP - Reports she hasn't regularly monitored her blood pressure s/p her hysterectomy but offered some readings of 110-120s/70-84 with pulses 88-100.   Weight Loss - Reports her activity is reduced s/p her hysterectomy but she is still walking daily.   Depression - States that she's doing much better now that she's stopped Lexapro.   S/p Hysterectomy - Underwent 11/04. She reports that she is healing better than expected. Reports that her blood sugars have been low (in the 70s) following her operation even after eating - her most recent highest was 90; meanwhile her glucose on 11/04 was 214 but she notes all she'd eaten before blood being drawn was a cookie and a cinnamon raisin bagel.   Health Maintenance Due  Topic Date Due   COVID-19 Vaccine  Never done   Past Medical History:  Diagnosis Date   Cancer (HCC) 8/23   Skin cancer   PONV (postoperative nausea and vomiting)    Redundant colon    Skin abnormality    right buttock precancer area removed9-23-2024 area healing   Past Surgical History:  Procedure Laterality Date   ABDOMINAL EXPLORATION SURGERY  2008   with diagnosis of redundant colon   APPENDECTOMY  2008   BREAST EXCISIONAL BIOPSY Left 2010   BREAST EXCISIONAL BIOPSY Right 2010   BREAST SURGERY  2011   lumps removed   ROBOTIC ASSISTED LAPAROSCOPIC HYSTERECTOMY AND SALPINGECTOMY Bilateral 01/05/2023   Procedure: XI ROBOTIC ASSISTED LAPAROSCOPIC HYSTERECTOMY AND SALPINGECTOMY;  Surgeon: Sherian Rein, MD;  Location: [facility];  Service: Gynecology;  Laterality: Bilateral;   skin cancer removal     12/2021 for breast and 2024 right leg 08/2022   Current Outpatient Medications:    cetirizine (ALL DAY ALLERGY)  10 MG tablet, Take 10 mg by mouth at bedtime., Disp: , Rfl:    cholecalciferol (VITAMIN D3) 25 MCG (1000 UNIT) tablet, Take 1,000 Units by mouth daily., Disp: , Rfl:   No Known Allergies ROS neg/noncontributory except as noted HPI/below  Objective:  BP 124/87   Pulse (!) 109   Temp 98.3 F (36.8 C) (Temporal)   Resp 18   Ht 5' 4.5" (1.638 m)   Wt 211 lb 8 oz (95.9 kg)   LMP  (LMP Unknown) Comment: spotting for >6 months  SpO2 100%   BMI 35.74 kg/m  Wt Readings from Last 3 Encounters:  01/23/23 211 lb 8 oz (95.9 kg)  01/05/23 206 lb 14.4 oz (93.8 kg)  01/01/23 205 lb (93 kg)   Physical Exam   Gen: WDWN NAD HEENT: NCAT, conjunctiva not injected, sclera nonicteric NECK:  supple, no thyromegaly, no nodes, no carotid bruits CARDIAC: RRR, S1S2+, no murmur.  LUNGS: CTAB. No wheezes EXT:  no edema MSK: no gross abnormalities.  NEURO: A&O x3.  CN II-XII intact.  PSYCH: normal mood. Good eye contact Assessment & Plan:  Elevated blood pressure reading  Adjustment disorder with depressed mood  Class 2 severe obesity due to excess calories with serious comorbidity and body mass index (BMI) of 35.0 to 35.9 in adult [facility])  Need for tetanus booster -     Tdap vaccine greater than or equal to 7yo IM   Elevated blood  pressure.  - chronic.  Doing better.  Pt working on life style changes.  Will get back to harder exercise when cleared from hyst.  Continue to work on it and monitor  if consistently >135/85, let me know Adjustment disorder-pt ok w/how she feels as didn't do well on meds.  HR tachy but pt anxious.  Usu <90 Wt-working on it.  Monitor sugars.  Per pt, the 200 in hosp(she was unaware), was just after eating.    Return in about 6 months (around 07/23/2023) for annual physical.    I,Emily Lagle,acting as a scribe for Angelena Sole, MD.,have documented all relevant documentation on the behalf of Angelena Sole, MD,as directed by  Angelena Sole, MD while in the presence of Angelena Sole, MD.  I, Angelena Sole, MD, have reviewed all documentation for this visit. The documentation on 01/24/23 for the exam, diagnosis, procedures, and orders are all accurate and complete.   Angelena Sole, MD

## 2023-01-23 ENCOUNTER — Ambulatory Visit (INDEPENDENT_AMBULATORY_CARE_PROVIDER_SITE_OTHER): Payer: 59 | Admitting: Family Medicine

## 2023-01-23 ENCOUNTER — Encounter: Payer: Self-pay | Admitting: Family Medicine

## 2023-01-23 VITALS — BP 124/87 | HR 109 | Temp 98.3°F | Resp 18 | Ht 64.5 in | Wt 211.5 lb

## 2023-01-23 DIAGNOSIS — F4321 Adjustment disorder with depressed mood: Secondary | ICD-10-CM | POA: Diagnosis not present

## 2023-01-23 DIAGNOSIS — R030 Elevated blood-pressure reading, without diagnosis of hypertension: Secondary | ICD-10-CM | POA: Diagnosis not present

## 2023-01-23 DIAGNOSIS — Z6835 Body mass index (BMI) 35.0-35.9, adult: Secondary | ICD-10-CM

## 2023-01-23 DIAGNOSIS — Z23 Encounter for immunization: Secondary | ICD-10-CM | POA: Diagnosis not present

## 2023-01-23 NOTE — Patient Instructions (Signed)
It was very nice to see you today!  Occ check sugar 2 hrs after meal.   PLEASE NOTE:  If you had any lab tests please let us know if you have not heard back within a few days. You may see your results on MyChart before we have a chance to review them but we will give you a call once they are reviewed by [place]. If we ordered any referrals today, please let us know if you have not heard from their office within the next week.   Please try these tips to maintain a healthy lifestyle:  Eat most of your calories during the day when you are active. Eliminate processed foods including packaged sweets (pies, cakes, cookies), reduce intake of potatoes, white bread, white pasta, and white rice. Look for whole grain options, oat flour or almond flour.  Each meal should contain half fruits/vegetables, one quarter protein, and one quarter carbs (no bigger than a computer mouse).  Cut down on sweet beverages. This includes juice, soda, and sweet tea. Also watch fruit intake, though this is a healthier sweet option, it still contains natural sugar! Limit to 3 servings daily.  Drink at least 1 glass of water with each meal and aim for at least 8 glasses per day  Exercise at least 150 minutes every week.

## 2023-07-31 ENCOUNTER — Encounter: Payer: 59 | Admitting: Family Medicine
